# Patient Record
Sex: Female | Born: 1950 | Race: White | Hispanic: No | Marital: Married | Smoking: Never smoker
Health system: Southern US, Community
[De-identification: ages and names within clinical notes are randomized; demographics above are authoritative.]

## PROBLEM LIST (undated history)

## (undated) DIAGNOSIS — Z8249 Family history of ischemic heart disease and other diseases of the circulatory system: Secondary | ICD-10-CM

## (undated) DIAGNOSIS — E785 Hyperlipidemia, unspecified: Secondary | ICD-10-CM

## (undated) DIAGNOSIS — T7840XA Allergy, unspecified, initial encounter: Secondary | ICD-10-CM

## (undated) DIAGNOSIS — E079 Disorder of thyroid, unspecified: Secondary | ICD-10-CM

## (undated) DIAGNOSIS — R42 Dizziness and giddiness: Secondary | ICD-10-CM

## (undated) DIAGNOSIS — R0789 Other chest pain: Secondary | ICD-10-CM

## (undated) DIAGNOSIS — E2839 Other primary ovarian failure: Secondary | ICD-10-CM

## (undated) DIAGNOSIS — R931 Abnormal findings on diagnostic imaging of heart and coronary circulation: Secondary | ICD-10-CM

## (undated) HISTORY — DX: Dizziness and giddiness: R42

## (undated) HISTORY — DX: Allergy, unspecified, initial encounter: T78.40XA

## (undated) HISTORY — DX: Disorder of thyroid, unspecified: E07.9

## (undated) HISTORY — DX: Other chest pain: R07.89

## (undated) HISTORY — DX: Hyperlipidemia, unspecified: E78.5

## (undated) HISTORY — PX: ECTOPIC PREGNANCY SURGERY: SHX613

## (undated) HISTORY — DX: Abnormal findings on diagnostic imaging of heart and coronary circulation: R93.1

## (undated) HISTORY — DX: Other primary ovarian failure: E28.39

## (undated) HISTORY — DX: Family history of ischemic heart disease and other diseases of the circulatory system: Z82.49

---

## 1998-03-13 ENCOUNTER — Other Ambulatory Visit: Admission: RE | Admit: 1998-03-13 | Discharge: 1998-03-13 | Payer: Self-pay | Admitting: *Deleted

## 2001-01-17 ENCOUNTER — Other Ambulatory Visit: Admission: RE | Admit: 2001-01-17 | Discharge: 2001-01-17 | Payer: Self-pay | Admitting: *Deleted

## 2001-07-06 ENCOUNTER — Emergency Department (HOSPITAL_COMMUNITY): Admission: EM | Admit: 2001-07-06 | Discharge: 2001-07-07 | Payer: Self-pay | Admitting: Emergency Medicine

## 2001-07-07 ENCOUNTER — Emergency Department (HOSPITAL_COMMUNITY): Admission: EM | Admit: 2001-07-07 | Discharge: 2001-07-07 | Payer: Self-pay

## 2001-07-07 ENCOUNTER — Encounter: Payer: Self-pay | Admitting: Emergency Medicine

## 2002-01-12 ENCOUNTER — Encounter: Admission: RE | Admit: 2002-01-12 | Discharge: 2002-01-12 | Payer: Self-pay | Admitting: *Deleted

## 2002-01-12 ENCOUNTER — Encounter: Payer: Self-pay | Admitting: *Deleted

## 2004-02-25 ENCOUNTER — Emergency Department (HOSPITAL_COMMUNITY): Admission: EM | Admit: 2004-02-25 | Discharge: 2004-02-25 | Payer: Self-pay | Admitting: Emergency Medicine

## 2004-02-26 ENCOUNTER — Emergency Department (HOSPITAL_COMMUNITY): Admission: EM | Admit: 2004-02-26 | Discharge: 2004-02-26 | Payer: Self-pay

## 2005-06-01 ENCOUNTER — Encounter: Admission: RE | Admit: 2005-06-01 | Discharge: 2005-06-01 | Payer: Self-pay | Admitting: *Deleted

## 2005-06-17 ENCOUNTER — Encounter: Admission: RE | Admit: 2005-06-17 | Discharge: 2005-06-17 | Payer: Self-pay | Admitting: *Deleted

## 2005-10-18 ENCOUNTER — Other Ambulatory Visit: Admission: RE | Admit: 2005-10-18 | Discharge: 2005-10-18 | Payer: Self-pay | Admitting: *Deleted

## 2005-10-20 ENCOUNTER — Encounter: Admission: RE | Admit: 2005-10-20 | Discharge: 2005-10-20 | Payer: Self-pay | Admitting: *Deleted

## 2006-07-27 ENCOUNTER — Encounter: Admission: RE | Admit: 2006-07-27 | Discharge: 2006-07-27 | Payer: Self-pay | Admitting: *Deleted

## 2009-02-06 ENCOUNTER — Encounter: Admission: RE | Admit: 2009-02-06 | Discharge: 2009-02-06 | Payer: Self-pay | Admitting: Family Medicine

## 2011-04-29 ENCOUNTER — Other Ambulatory Visit: Payer: Self-pay | Admitting: Family Medicine

## 2011-04-29 DIAGNOSIS — Z1231 Encounter for screening mammogram for malignant neoplasm of breast: Secondary | ICD-10-CM

## 2011-05-05 ENCOUNTER — Ambulatory Visit
Admission: RE | Admit: 2011-05-05 | Discharge: 2011-05-05 | Disposition: A | Discharge status: Home / Self Care | Payer: BC Managed Care – PPO | Source: Ambulatory Visit | Attending: Family Medicine | Admitting: Family Medicine

## 2011-05-05 DIAGNOSIS — Z1231 Encounter for screening mammogram for malignant neoplasm of breast: Secondary | ICD-10-CM

## 2011-05-26 ENCOUNTER — Encounter: Payer: Self-pay | Admitting: *Deleted

## 2011-05-26 DIAGNOSIS — R0789 Other chest pain: Secondary | ICD-10-CM | POA: Insufficient documentation

## 2016-04-14 ENCOUNTER — Encounter (HOSPITAL_COMMUNITY): Payer: Self-pay | Admitting: Emergency Medicine

## 2016-04-14 ENCOUNTER — Emergency Department (HOSPITAL_COMMUNITY): Payer: BLUE CROSS/BLUE SHIELD

## 2016-04-14 ENCOUNTER — Emergency Department (HOSPITAL_COMMUNITY)
Admission: EM | Admit: 2016-04-14 | Discharge: 2016-04-14 | Disposition: A | Discharge status: Home / Self Care | Payer: BLUE CROSS/BLUE SHIELD | Attending: Emergency Medicine | Admitting: Emergency Medicine

## 2016-04-14 DIAGNOSIS — Z79899 Other long term (current) drug therapy: Secondary | ICD-10-CM | POA: Insufficient documentation

## 2016-04-14 DIAGNOSIS — E039 Hypothyroidism, unspecified: Secondary | ICD-10-CM | POA: Diagnosis not present

## 2016-04-14 DIAGNOSIS — R0789 Other chest pain: Secondary | ICD-10-CM | POA: Diagnosis present

## 2016-04-14 LAB — HEPATIC FUNCTION PANEL
ALBUMIN: 4.4 g/dL (ref 3.5–5.0)
ALT: 19 U/L (ref 14–54)
AST: 27 U/L (ref 15–41)
Alkaline Phosphatase: 84 U/L (ref 38–126)
Bilirubin, Direct: <0.1 mg/dL — ABNORMAL LOW (ref 0.1–0.5)
TOTAL PROTEIN: 7.1 g/dL (ref 6.5–8.1)
Total Bilirubin: 0.4 mg/dL (ref 0.3–1.2)

## 2016-04-14 LAB — CBC
HCT: 39 % (ref 36.0–46.0)
Hemoglobin: 13.2 g/dL (ref 12.0–15.0)
MCH: 31.7 pg (ref 26.0–34.0)
MCHC: 33.8 g/dL (ref 30.0–36.0)
MCV: 93.8 fL (ref 78.0–100.0)
PLATELETS: 206 10*3/uL (ref 150–400)
RBC: 4.16 MIL/uL (ref 3.87–5.11)
RDW: 13 % (ref 11.5–15.5)
WBC: 5 10*3/uL (ref 4.0–10.5)

## 2016-04-14 LAB — T4, FREE: FREE T4: 0.77 ng/dL (ref 0.61–1.12)

## 2016-04-14 LAB — BASIC METABOLIC PANEL
Anion gap: 8 (ref 5–15)
BUN: 19 mg/dL (ref 6–20)
CHLORIDE: 105 mmol/L (ref 101–111)
CO2: 25 mmol/L (ref 22–32)
CREATININE: 0.77 mg/dL (ref 0.44–1.00)
Calcium: 9.6 mg/dL (ref 8.9–10.3)
GFR calc non Af Amer: >60 mL/min (ref >60)
Glucose, Bld: 109 mg/dL — ABNORMAL HIGH (ref 65–99)
Potassium: 4 mmol/L (ref 3.5–5.1)
SODIUM: 138 mmol/L (ref 135–145)

## 2016-04-14 LAB — TROPONIN I

## 2016-04-14 LAB — LIPASE, BLOOD: LIPASE: 36 U/L (ref 11–51)

## 2016-04-14 LAB — I-STAT TROPONIN, ED: TROPONIN I, POC: 0 ng/mL (ref 0.00–0.08)

## 2016-04-14 LAB — TSH: TSH: 1.839 u[IU]/mL (ref 0.350–4.500)

## 2016-04-14 MED ORDER — OMEPRAZOLE 20 MG PO CPDR: 20.0000 mg | DELAYED_RELEASE_CAPSULE | Freq: Every day | ORAL | 0 refills | Status: DC

## 2016-04-14 MED ORDER — GI COCKTAIL ~~LOC~~
30.0000 mL | Freq: Once | ORAL | Status: AC
  Administered 2016-04-14: 30 mL via ORAL
  Filled 2016-04-14: qty 30

## 2016-04-14 NOTE — ED Triage Notes (Addendum)
Pt c/o racing heart and elevated heart rate; pt states her face started feeling numb on the way to ER; states she is under a lot of stress due to putting her mother in a nursing home; pt ambulatory to triage room; tearful and anxious during triage; BP 201/111; pt took 2 baby aspirin PTA

## 2016-04-14 NOTE — ED Provider Notes (Signed)
[facility]-EMERGENCY DEPT Provider Note   CSN: [id number] Arrival date & time: 04/14/16  0801     History   Chief Complaint Chief Complaint  Patient presents with  . Tachycardia  . Hypertension    HPI Toni Harris is a 66 y.o. female.  HPI  Patient presents after an episode of chest tightness, palpitations. Significant today, after awakening. Patient was well last night, going to bed. She notes that upon awakening she felt tightness, palpitations, numbness. There is some associated facial dysesthesia. Symptoms improved spontaneously, without clear intervention, and she has minimal symptoms currently. Patient notes a history of hypertension, denies diabetes, denies heart disease. Patient drinks, does not smoke.   Past Medical History:  Diagnosis Date  . Chest tightness   . Hyperlipidemia   . Thyroid disease    hypo    Patient Active Problem List   Diagnosis Date Noted  . Chest tightness     Past Surgical History:  Procedure Laterality Date  . ECTOPIC PREGNANCY SURGERY     x2    OB History    No data available       Home Medications    Prior to Admission medications   Medication Sig Start Date End Date Taking? Authorizing Provider  fluticasone (FLONASE) 50 MCG/ACT nasal spray Place 1 spray into both nostrils daily as needed for allergies or rhinitis.   Yes Historical Provider, MD  amoxicillin-clavulanate (AUGMENTIN) 875-125 MG tablet Take 1 tablet by mouth 2 (two) times daily.    Historical Provider, MD    Family History No family history on file.  Social History Social History  Substance Use Topics  . Smoking status: Never Smoker  . Smokeless tobacco: Never Used  . Alcohol use Yes     Comment: daily     Allergies   Patient has no known allergies.   Review of Systems Review of Systems  Constitutional:       Per HPI, otherwise negative  HENT:       Per HPI, otherwise negative  Respiratory:       Per HPI, otherwise negative    Cardiovascular:       Per HPI, otherwise negative  Gastrointestinal: Negative for vomiting.  Endocrine:       Negative aside from HPI  Genitourinary:       Neg aside from HPI   Musculoskeletal:       Per HPI, otherwise negative  Skin: Negative.   Neurological: Negative for syncope.     Physical Exam Updated Vital Signs BP 157/81 (BP Location: Left Arm)   Pulse 75   Temp 97.5 F (36.4 C) (Oral)   Resp 16   SpO2 100%   Physical Exam  Constitutional: She is oriented to person, place, and time. She appears well-developed and well-nourished. No distress.  HENT:  Head: Normocephalic and atraumatic.  Eyes: Conjunctivae and EOM are normal.  Cardiovascular: Normal rate, regular rhythm and intact distal pulses.   No appreciable distinction between peripheral pulses  Pulmonary/Chest: Effort normal and breath sounds normal. No stridor. No respiratory distress.  Abdominal: She exhibits no distension.  Musculoskeletal: She exhibits no edema.  Neurological: She is alert and oriented to person, place, and time. No cranial nerve deficit. Coordination normal.  Skin: Skin is warm and dry.  Psychiatric: She has a normal mood and affect.  Nursing note and vitals reviewed.    ED Treatments / Results  Labs (all labs ordered are listed, but only abnormal results are displayed) Labs Reviewed  BASIC METABOLIC PANEL - Abnormal; Notable for the following:       Result Value   Glucose, Bld 109 (*)    All other components within normal limits  HEPATIC FUNCTION PANEL - Abnormal; Notable for the following:    Bilirubin, Direct <0.1 (*)    All other components within normal limits  CBC  LIPASE, BLOOD  TSH  TROPONIN I  T4, FREE  I-STAT TROPOININ, ED    EKG  EKG Interpretation  Date/Time:  Wednesday April 14 2016 08:31:12 EST Ventricular Rate:  98 PR Interval:    QRS Duration: 82 QT Interval:  360 QTC Calculation: 460 R Axis:   62 Text Interpretation:  Sinus rhythm Low voltage,  precordial leads Artifact Abnormal ekg Confirmed by Gerhard Munch  MD ([id number]) on 04/14/2016 8:35:02 AM       Radiology Dg Chest 2 View  Result Date: 04/14/2016 CLINICAL DATA:  Chest pain.  Palpitations.  Tachycardia. EXAM: CHEST  2 VIEW COMPARISON:  None FINDINGS: The heart size and mediastinal contours are within normal limits. Both lungs are clear. The visualized skeletal structures are unremarkable. IMPRESSION: Normal chest Electronically Signed   By: Francene Boyers M.D.   On: 04/14/2016 09:13    Procedures Procedures (including critical care time)  Medications Ordered in ED Medications  gi cocktail (Maalox,Lidocaine,Donnatal) (30 mLs Oral Given 04/14/16 0916)     Initial Impression / Assessment and Plan / ED Course  I have reviewed the triage vital signs and the nursing notes.  Pertinent labs & imaging results that were available during my care of the patient were reviewed by me and considered in my medical decision making (see chart for details).  Update: After returned initial labs and discussed the findings with patient and her husband. Patient states that she has not been taking thyroid medication years, after a reassuring lab results. Troponin 2 pending   1:09 PM Patient in no distress awake, alert, smiling. We discussed all findings again at length, including 2 negative troponin. Patient is no ongoing symptoms. With resolution of symptoms, serial negative troponin, nonischemic EKG, no history of CAD, there is no evidence for PE, ammonia, other acute new pathology. Patient may have gastroesophageal etiology, but low suspicion for other occult new acute disease. Patient started on PPI therapy, will follow up with GI, primary care.  Final Clinical Impressions(s) / ED Diagnoses  A typical chest pain   Gerhard Munch, MD 04/14/16 1310

## 2016-04-14 NOTE — ED Notes (Signed)
She states she is no longer experiencing palpitations.

## 2016-04-14 NOTE — Discharge Instructions (Signed)
As discussed, today's evaluation has been generally reassuring.  Your pain may be due to inflammation of the stomach and esophagus.  Please take all medication as directed, and be sure to follow-up with both GASTROENTEROLOGIST and your primary care physician.  Return here for concerning changes in your condition.

## 2018-02-13 ENCOUNTER — Other Ambulatory Visit (INDEPENDENT_AMBULATORY_CARE_PROVIDER_SITE_OTHER): Payer: Self-pay | Admitting: Physician Assistant

## 2018-02-13 ENCOUNTER — Ambulatory Visit
Admission: RE | Admit: 2018-02-13 | Discharge: 2018-02-13 | Disposition: A | Discharge status: Home / Self Care | Payer: BLUE CROSS/BLUE SHIELD | Source: Ambulatory Visit | Attending: Physician Assistant | Admitting: Physician Assistant

## 2018-02-13 DIAGNOSIS — R52 Pain, unspecified: Secondary | ICD-10-CM

## 2018-11-29 ENCOUNTER — Other Ambulatory Visit: Payer: Self-pay

## 2018-11-29 ENCOUNTER — Ambulatory Visit (HOSPITAL_COMMUNITY)
Admission: RE | Admit: 2018-11-29 | Discharge: 2018-11-29 | Disposition: A | Discharge status: Home / Self Care | Payer: Medicare Other | Source: Ambulatory Visit | Attending: Family Medicine | Admitting: Family Medicine

## 2018-11-29 ENCOUNTER — Other Ambulatory Visit (HOSPITAL_COMMUNITY): Payer: Self-pay | Admitting: Family Medicine

## 2018-11-29 DIAGNOSIS — M7989 Other specified soft tissue disorders: Secondary | ICD-10-CM | POA: Diagnosis not present

## 2018-11-29 DIAGNOSIS — M79604 Pain in right leg: Secondary | ICD-10-CM | POA: Insufficient documentation

## 2019-07-16 ENCOUNTER — Other Ambulatory Visit: Payer: Self-pay | Admitting: *Deleted

## 2019-07-16 ENCOUNTER — Telehealth (HOSPITAL_COMMUNITY): Payer: Self-pay

## 2019-07-16 DIAGNOSIS — M7989 Other specified soft tissue disorders: Secondary | ICD-10-CM

## 2019-07-16 NOTE — Telephone Encounter (Signed)

## 2019-07-17 ENCOUNTER — Encounter: Payer: Self-pay | Admitting: Vascular Surgery

## 2019-07-17 ENCOUNTER — Ambulatory Visit (HOSPITAL_COMMUNITY)
Admission: RE | Admit: 2019-07-17 | Discharge: 2019-07-17 | Disposition: A | Discharge status: Home / Self Care | Payer: Medicare Other | Source: Ambulatory Visit | Attending: Vascular Surgery | Admitting: Vascular Surgery

## 2019-07-17 ENCOUNTER — Ambulatory Visit: Payer: Medicare Other | Admitting: Vascular Surgery

## 2019-07-17 ENCOUNTER — Other Ambulatory Visit: Payer: Self-pay

## 2019-07-17 VITALS — BP 166/89 | HR 71 | Temp 97.8°F | Resp 20 | Ht 66.0 in | Wt 152.0 lb

## 2019-07-17 DIAGNOSIS — M7989 Other specified soft tissue disorders: Secondary | ICD-10-CM

## 2019-07-17 DIAGNOSIS — M79604 Pain in right leg: Secondary | ICD-10-CM

## 2019-07-17 NOTE — Progress Notes (Signed)
Vascular and Vein Specialist of [facility]  Patient name: Toni Harris MRN: [id number] DOB: [birth year] Sex: female  REASON FOR CONSULT: Evaluation venous pathology right greater than left lower extremity  HPI: Toni Harris is a 68 y.o. female, who is here today for evaluation.  She was concern regarding potential issues regarding her lower extremity venous pathology.  She had family member suffered a pulmonary embolus in the past.  She also was diagnosed with right leg Baker's cyst in September 2020 and was concerned regarding some potential relationship with this.  She has no history of DVT or pulmonary embolus or self.  No history of superficial thrombophlebitis.  She is otherwise healthy.  She has minimal lower extremity swelling  Past Medical History:  Diagnosis Date  . Allergy   . Chest tightness   . Hyperlipidemia   . Thyroid disease    hypo    History reviewed. No pertinent family history.  SOCIAL HISTORY: Social History   Socioeconomic History  . Marital status: Married    Spouse name: Not on file  . Number of children: Not on file  . Years of education: Not on file  . Highest education level: Not on file  Occupational History  . Not on file  Tobacco Use  . Smoking status: Never Smoker  . Smokeless tobacco: Never Used  Substance and Sexual Activity  . Alcohol use: Yes    Comment: daily  . Drug use: Yes    Types: Marijuana    Comment: occasionally  . Sexual activity: Not on file  Other Topics Concern  . Not on file  Social History Narrative  . Not on file   Social Determinants of Health   Financial Resource Strain:   . Difficulty of Paying Living Expenses:   Food Insecurity:   . Worried About Charity fundraiser in the Last Year:   . Arboriculturist in the Last Year:   Transportation Needs:   . Film/video editor (Medical):   Marland Kitchen Lack of Transportation (Non-Medical):   Physical Activity:   . Days of Exercise  per Week:   . Minutes of Exercise per Session:   Stress:   . Feeling of Stress :   Social Connections:   . Frequency of Communication with Friends and Family:   . Frequency of Social Gatherings with Friends and Family:   . Attends Religious Services:   . Active Member of Clubs or Organizations:   . Attends Archivist Meetings:   Marland Kitchen Marital Status:   Intimate Partner Violence:   . Fear of Current or Ex-Partner:   . Emotionally Abused:   Marland Kitchen Physically Abused:   . Sexually Abused:     No Known Allergies  Current Outpatient Medications  Medication Sig Dispense Refill  . fluticasone (FLONASE) 50 MCG/ACT nasal spray Place 1 spray into both nostrils daily as needed for allergies or rhinitis.     No current facility-administered medications for this visit.    REVIEW OF SYSTEMS:  [X]  denotes positive finding, [ ]  denotes negative finding Cardiac  Comments:  Chest pain or chest pressure:    Shortness of breath upon exertion:    Short of breath when lying flat:    Irregular heart rhythm:        Vascular    Pain in calf, thigh, or hip brought on by ambulation:    Pain in feet at night that wakes you up from your sleep:  Blood clot in your veins:    Leg swelling:         Pulmonary    Oxygen at home:    Productive cough:     Wheezing:         Neurologic    Sudden weakness in arms or legs:     Sudden numbness in arms or legs:     Sudden onset of difficulty speaking or slurred speech:    Temporary loss of vision in one eye:     Problems with dizziness:         Gastrointestinal    Blood in stool:     Vomited blood:         Genitourinary    Burning when urinating:     Blood in urine:        Psychiatric    Major depression:         Hematologic    Bleeding problems:    Problems with blood clotting too easily:        Skin    Rashes or ulcers:        Constitutional    Fever or chills:      PHYSICAL EXAM: Vitals:   07/17/19 1537  BP: (!) 166/89   Pulse: 71  Resp: 20  Temp: 97.8 F (36.6 C)  SpO2: 97%  Weight: 152 lb (68.9 kg)  Height: 5\' 6"  (1.676 m)    GENERAL: The patient is a well-nourished female, in no acute distress. The vital signs are documented above. CARDIOVASCULAR: 2+ radial and 2+ dorsalis pedis pulses bilaterally.  Scattered reticular veins in both lower extremities over the left pretibial space and medial right thigh PULMONARY: There is good air exchange  ABDOMEN: Soft and non-tender  MUSCULOSKELETAL: There are no major deformities or cyanosis. NEUROLOGIC: No focal weakness or paresthesias are detected. SKIN: There are no ulcers or rashes noted. PSYCHIATRIC: The patient has a normal affect.  DATA:  Duplex shows no evidence of DVT or venous obstruction.  She does not have any enlargement of her saphenous vein bilaterally.  She does have reflux in her right great saphenous vein and her left saphenofemoral junction  MEDICAL ISSUES: I discussed these findings with the patient.  Explained that she does not have any evidence of severe venous pathology.  I would recommend continued conservative observation only.  She is having minimal symptoms and therefore would not recommend compression garments.  She was reassured with this discussion see again on an as-needed basis   [place], MD [facility] Vascular and Vein Specialists of [facility] Tel [phone] Pager [phone]

## 2019-07-23 IMAGING — CR DG SHOULDER 2+V*L*
3 series · 3 of 3 positions shown · non-contrast
Comparison: None.

CLINICAL DATA: Chronic left shoulder pain

EXAM:
LEFT SHOULDER - 2+ VIEW

[w shoulder grashey left]
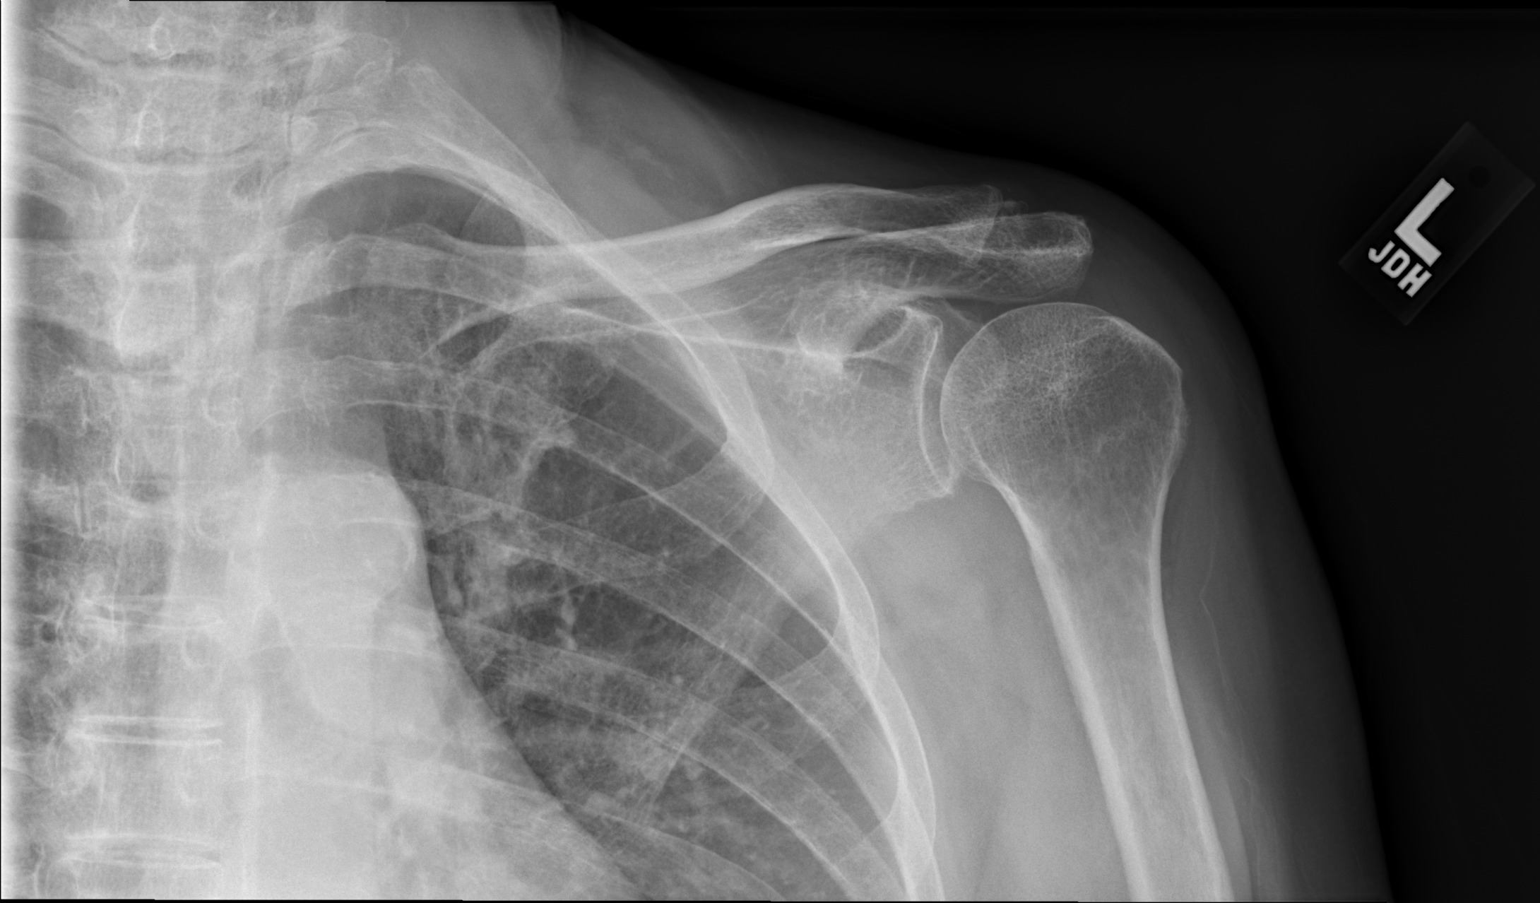

[w shoulder y-view left]
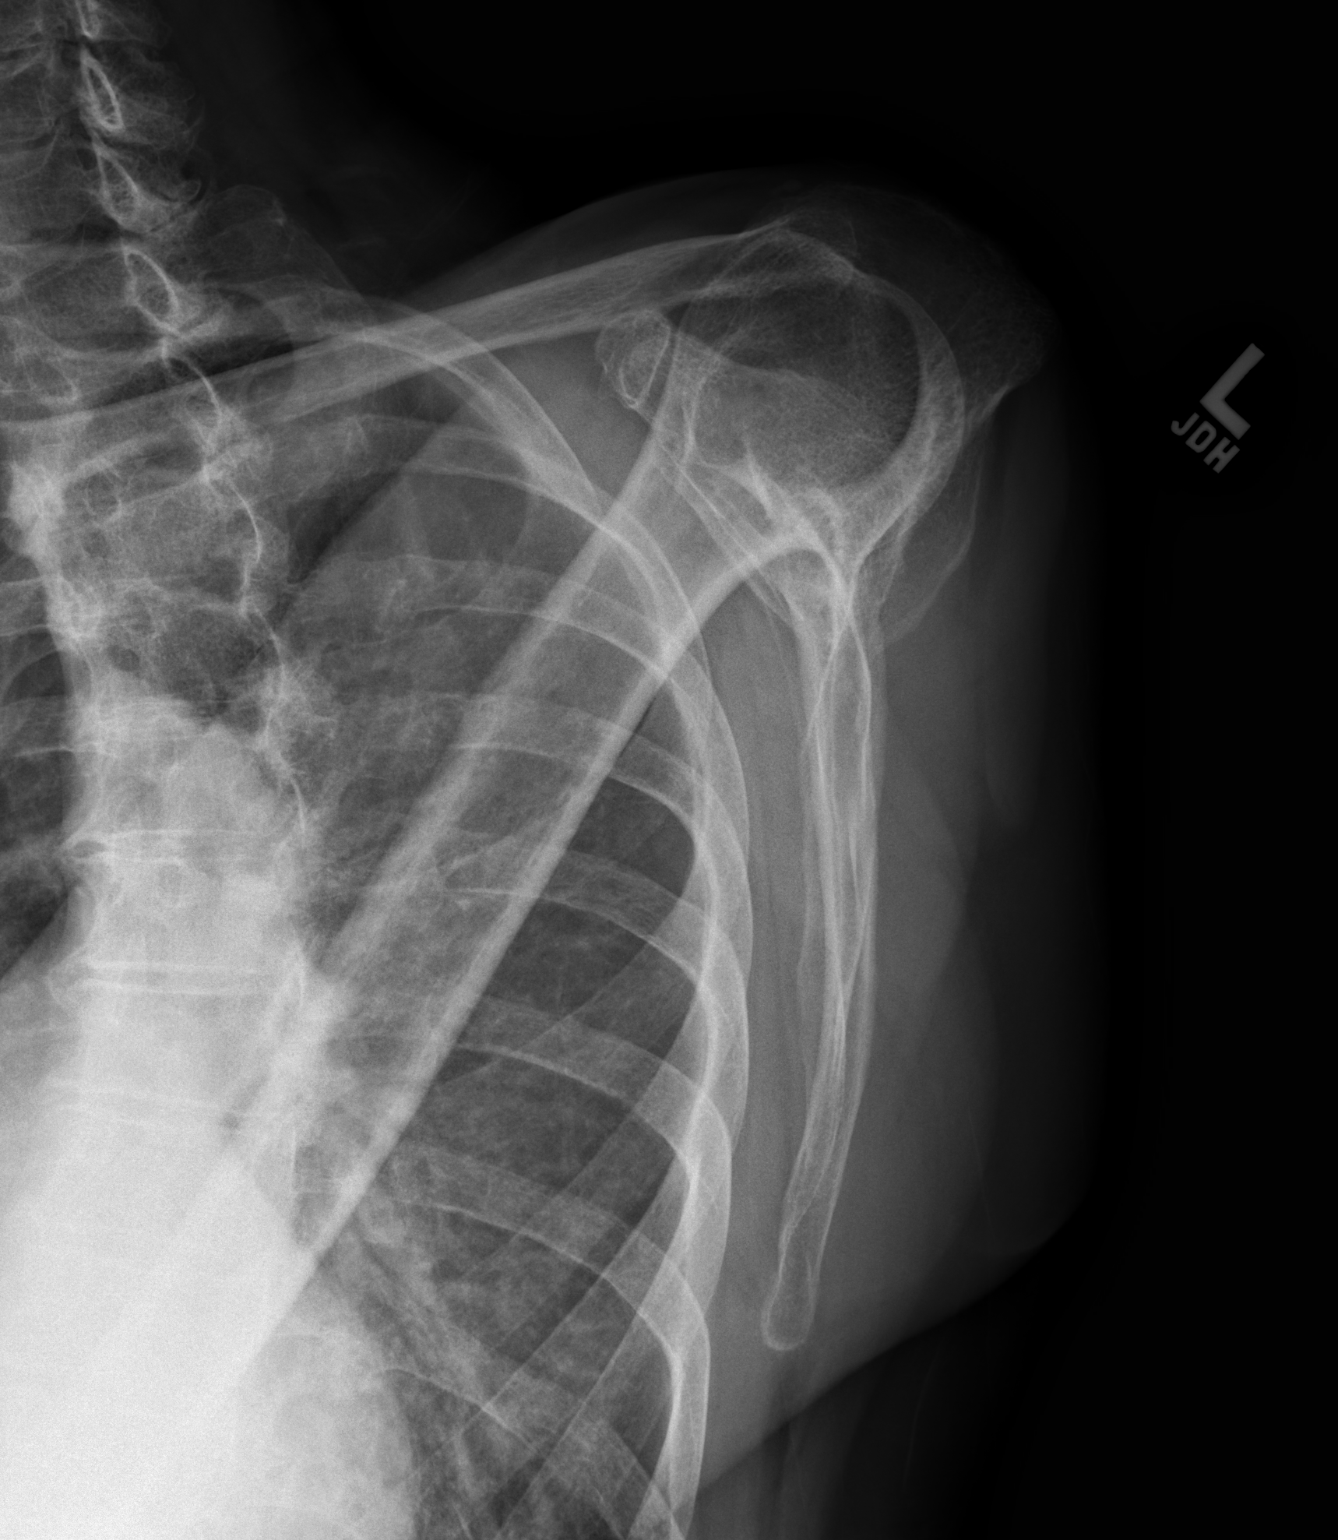

[w shoulder axillary left]
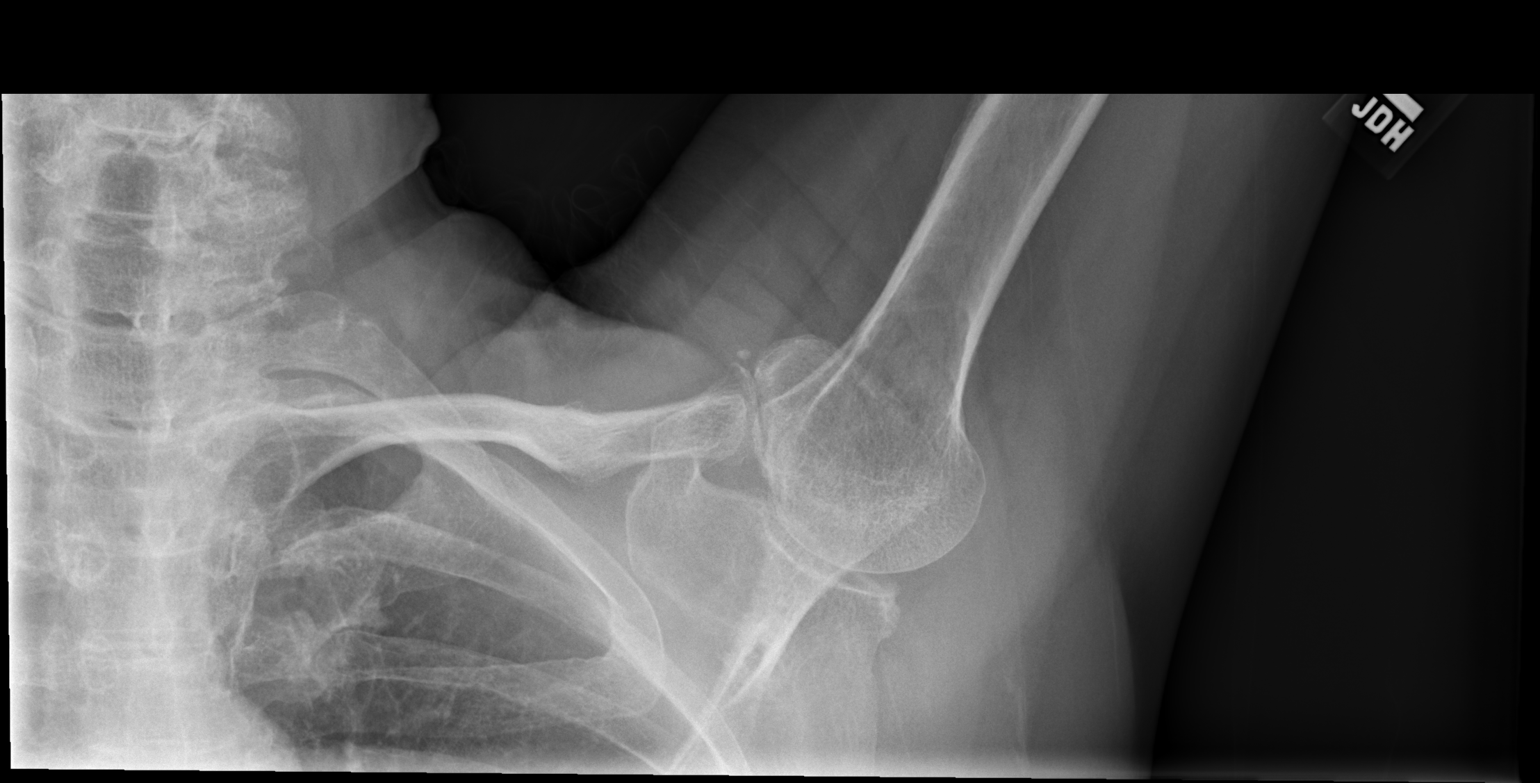

[3 of 3 positions shown; findings below may reference images not displayed]

FINDINGS: Degenerative changes in the AC joint with joint space narrowing and
spurring. Glenohumeral joint is maintained. No acute bony
abnormality. Specifically, no fracture, subluxation, or dislocation.
Soft tissues are intact.
IMPRESSION: Mild-to-moderate degenerative changes in the left AC joint. No acute
bony abnormality.

## 2019-10-25 DIAGNOSIS — I8393 Asymptomatic varicose veins of bilateral lower extremities: Secondary | ICD-10-CM | POA: Insufficient documentation

## 2020-06-12 ENCOUNTER — Other Ambulatory Visit: Payer: Self-pay

## 2020-06-12 ENCOUNTER — Ambulatory Visit: Payer: Medicare Other | Admitting: Vascular Surgery

## 2020-06-12 ENCOUNTER — Encounter: Payer: Self-pay | Admitting: Vascular Surgery

## 2020-06-12 VITALS — BP 140/85 | HR 87 | Temp 98.1°F | Resp 16 | Ht 66.0 in | Wt 152.0 lb

## 2020-06-12 DIAGNOSIS — I872 Venous insufficiency (chronic) (peripheral): Secondary | ICD-10-CM | POA: Diagnosis not present
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
## 2021-12-20 NOTE — Progress Notes (Unsigned)
Cardiology Office Note:    Date:  12/20/2021   ID:  Toni Harris, DOB [birth year], MRN [id number]  PCP:  Patient, No Pcp Per   [facility] Providers Cardiologist:  None { Click to update primary MD,subspecialty MD or APP then REFRESH:1}    Referring MD: Aletha Halim., PA-C   No chief complaint on file. ***  History of Present Illness:    Toni Harris is a 71 y.o. female with a hx of ***  Past Medical History:  Diagnosis Date   Agatston CAC score, <100    Allergy    Chest tightness    Estrogen deficiency    Family history of cardiovascular disease    Hyperlipidemia    Thyroid disease    hypo   Vertigo     Past Surgical History:  Procedure Laterality Date   ECTOPIC PREGNANCY SURGERY     x2    Current Medications: No outpatient medications have been marked as taking for the 12/24/21 encounter (Appointment) with Freada Bergeron, MD.     Allergies:   Patient has no known allergies.   Social History   Socioeconomic History   Marital status: Married    Spouse name: Not on file   Number of children: Not on file   Years of education: Not on file   Highest education level: Not on file  Occupational History   Not on file  Tobacco Use   Smoking status: Never   Smokeless tobacco: Never  Vaping Use   Vaping Use: Never used  Substance and Sexual Activity   Alcohol use: Yes    Comment: daily   Drug use: Yes    Types: Marijuana    Comment: occasionally   Sexual activity: Not on file  Other Topics Concern   Not on file  Social History Narrative   Not on file   Social Determinants of Health   Financial Resource Strain: Not on file  Food Insecurity: Not on file  Transportation Needs: Not on file  Physical Activity: Not on file  Stress: Not on file  Social Connections: Not on file     Family History: The patient's ***family history includes Heart disease in her mother; Hodgkin's lymphoma in her father; Pulmonary embolism in her  mother; Varicose Veins in her maternal grandmother and mother.  ROS:   Please see the history of present illness.    *** All other systems reviewed and are negative.  EKGs/Labs/Other Studies Reviewed:    The following studies were reviewed today: ***  EKG:  EKG is *** ordered today.  The ekg ordered today demonstrates ***  Recent Labs: No results found for requested labs within last 365 days.  Recent Lipid Panel No results found for: "CHOL", "TRIG", "HDL", "CHOLHDL", "VLDL", "LDLCALC", "LDLDIRECT"   Risk Assessment/Calculations:   {Does this patient have ATRIAL FIBRILLATION?:[phone]}  No BP recorded.  {Refresh Note OR Click here to enter BP  :1}***         Physical Exam:    VS:  There were no vitals taken for this visit.    Wt Readings from Last 3 Encounters:  06/12/20 152 lb (68.9 kg)  07/17/19 152 lb (68.9 kg)     GEN: *** Well nourished, well developed in no acute distress HEENT: Normal NECK: No JVD; No carotid bruits LYMPHATICS: No lymphadenopathy CARDIAC: ***RRR, no murmurs, rubs, gallops RESPIRATORY:  Clear to auscultation without rales, wheezing or rhonchi  ABDOMEN: Soft, non-tender, non-distended MUSCULOSKELETAL:  No edema;  No deformity  SKIN: Warm and dry NEUROLOGIC:  Alert and oriented x 3 PSYCHIATRIC:  Normal affect   ASSESSMENT:    No diagnosis found. PLAN:    In order of problems listed above:  ***      {Are you ordering a CV Procedure (e.g. stress test, cath, DCCV, TEE, etc)?   Press F2        :[id number]}    Medication Adjustments/Labs and Tests Ordered: Current medicines are reviewed at length with the patient today.  Concerns regarding medicines are outlined above.  No orders of the defined types were placed in this encounter.  No orders of the defined types were placed in this encounter.   There are no Patient Instructions on file for this visit.   Signed, Meriam Sprague, MD  12/20/2021 2:45 PM    [facility]

## 2021-12-24 ENCOUNTER — Encounter: Payer: Self-pay | Admitting: Cardiology

## 2021-12-24 ENCOUNTER — Ambulatory Visit: Payer: Medicare Other | Attending: Cardiology | Admitting: Cardiology

## 2021-12-24 VITALS — BP 120/80 | HR 66 | Ht 66.0 in | Wt 149.0 lb

## 2021-12-24 DIAGNOSIS — Z8249 Family history of ischemic heart disease and other diseases of the circulatory system: Secondary | ICD-10-CM | POA: Diagnosis not present

## 2021-12-24 DIAGNOSIS — R931 Abnormal findings on diagnostic imaging of heart and coronary circulation: Secondary | ICD-10-CM

## 2021-12-24 DIAGNOSIS — E782 Mixed hyperlipidemia: Secondary | ICD-10-CM

## 2021-12-24 MED ORDER — EZETIMIBE 10 MG PO TABS: 10.0000 mg | ORAL_TABLET | Freq: Every day | ORAL | 3 refills | Status: DC

## 2021-12-24 NOTE — Patient Instructions (Signed)
Medication Instructions:   START TAKING ZETIA 10 MG BY MOUTH DAILY  *If you need a refill on your cardiac medications before your next appointment, please call your pharmacy*   You have been referred to [place]: [facility], you and your health needs are our priority.  As part of our continuing mission to provide you with exceptional heart care, we have created designated Provider Care Teams.  These Care Teams include your primary Cardiologist (physician) and Advanced Practice Providers (APPs -  Physician Assistants and Nurse Practitioners) who all work together to provide you with the care you need, when you need it.  We recommend signing up for the patient portal called "MyChart".  Sign up information is provided on this After Visit Summary.  MyChart is used to connect with patients for Virtual Visits (Telemedicine).  Patients are able to view lab/test results, encounter notes, upcoming appointments, etc.  Non-urgent messages can be sent to your provider as well.   To learn more about what you can do with MyChart, go to [email or web address].    Your next appointment:   1 year(s)  The format for your next appointment:   In Person  Provider:   DR. Johney Frame  Important Information About Sugar

## 2021-12-24 NOTE — Progress Notes (Signed)
Cardiology Office Note:    Date:  12/24/2021   ID:  Toni Harris, DOB [birth year], MRN [id number]  PCP:  [facility]   [facility] Providers Cardiologist:  None   Referring MD: Richmond Campbell., PA-C    History of Present Illness:    Toni Harris is a 71 y.o. female with a hx of HLD, coronary calcification with Ca score 77.1 (73%), and family history of CAD who was referred by Toni Gemma, PA-C for further evaluation of coronary artery calcification.  Patient was seen by Toni Harris on 11/24/21. Note reviewed. Underwent coronary Ca score which was 77.1 (73%). She has been maintained on crestor 20mg  daily.   Today, the patient states that she is feeling fine. She denies any chest pain, or shortness of breath.  For at least 2 years she has been on medication for hyperlipidemia. Currently she takes 20 mg rosuvastatin. During a recent trip to she forgot to pack her medications. She was off of them for about 2 weeks. Most recent lipids were not on her medication (LDL 146, TG 274)  She endorses venous insufficiency. Typically she sleeps with her legs elevated which helps manage her swelling.  In her family, she confirms known heart disease on her maternal side. Her grandfather had a heart attack at 3 yo, and died at 69 yo of a massive heart attack. Her grandmother had heart failure at 82 yo. Mother had a PE with cardiac arrest x2, she lived for 3 years afterwards.  Regarding her diet she has avoided red meats for 40 years. For exercise she participates in yoga for 4-5 days a week. She does not smoke.  She denies any palpitations, lightheadedness, headaches, syncope, orthopnea, or PND.  She complains of a tick bite on her abdomen that is bleeding. [place] tick bites and treated for tick-borne diseases.   Past Medical History:  Diagnosis Date   Agatston CAC score, <100    Allergy     Chest tightness    Estrogen deficiency    Family history of cardiovascular disease    Hyperlipidemia    Thyroid disease    hypo   Vertigo     Past Surgical History:  Procedure Laterality Date   ECTOPIC PREGNANCY SURGERY     x2    Current Medications: Current Meds  Medication Sig   ezetimibe (ZETIA) 10 MG tablet Take 1 tablet (10 mg total) by mouth daily.   fluticasone (FLONASE) 50 MCG/ACT nasal spray Place 1 spray into both nostrils daily as needed for allergies or rhinitis.   rizatriptan (MAXALT) 5 MG tablet TAKE 1 TABLET BY MOUTH AS NEEDED FOR MIGRAINE. MAY REPEAT IN 2 HOURS IF NEEDED   rosuvastatin (CRESTOR) 20 MG tablet Take 20 mg by mouth at bedtime.     Allergies:   Patient has no known allergies.   Social History   Socioeconomic History   Marital status: Married    Spouse name: Not on file   Number of children: Not on file   Years of education: Not on file   Highest education level: Not on file  Occupational History   Not on file  Tobacco Use   Smoking status: Never   Smokeless tobacco: Never  Vaping Use   Vaping Use: Never used  Substance and Sexual Activity   Alcohol use: Yes    Comment: daily   Drug use: Yes  Types: Marijuana    Comment: occasionally   Sexual activity: Not on file  Other Topics Concern   Not on file  Social History Narrative   Not on file   Social Determinants of Health   Financial Resource Strain: Not on file  Food Insecurity: Not on file  Transportation Needs: Not on file  Physical Activity: Not on file  Stress: Not on file  Social Connections: Not on file     Family History: The patient's family history includes Heart disease in her mother; Hodgkin's lymphoma in her father; Pulmonary embolism in her mother; Varicose Veins in her maternal grandmother and mother.  ROS:   Review of Systems  Constitutional:  Negative for chills and fever.  HENT:  Negative for nosebleeds and tinnitus.   Eyes:  Negative for blurred  vision and pain.  Respiratory:  Negative for cough, hemoptysis, shortness of breath and stridor.   Cardiovascular:  Negative for chest pain, palpitations, orthopnea, claudication, leg swelling and PND.  Gastrointestinal:  Negative for blood in stool, diarrhea, nausea and vomiting.  Genitourinary:  Negative for dysuria and hematuria.  Musculoskeletal:  Negative for falls.  Neurological:  Negative for dizziness, loss of consciousness and headaches.  Psychiatric/Behavioral:  Negative for depression, hallucinations and substance abuse. The patient does not have insomnia.      EKGs/Labs/Other Studies Reviewed:    The following studies were reviewed today:  CT Calcium Score  09/17/2021  ([facility]): FINDINGS:  CORONARY CALCIUM:  Left main coronary artery: 0  Left anterior descending: 52.248  Circumflex: 0  Right coronary artery: 24.89  Total Agatston score: 77.1  MESA database percentile: 73rd   INCIDENTAL FINDINGS: Aortic root and aortic valve calcifications. Degenerative changes of the visualized spine.   CONCLUSION:  Calcified atherosclerotic disease is evident within the coronary arterial distribution. The patient's total coronary artery calcium score is 77.1, which places the patient in the 73rd percentile for individuals of matched age, gender, and race/ethnicity.  EKG:  EKG is personally reviewed. 12/24/2021:  Sinus rhythm. Rate 66 bpm.   Recent Labs: No results found for requested labs within last 365 days.   Recent Lipid Panel No results found for: "CHOL", "TRIG", "HDL", "CHOLHDL", "VLDL", "LDLCALC", "LDLDIRECT"   Risk Assessment/Calculations:                Physical Exam:    VS:  BP 120/80 (BP Location: Left Arm, Patient Position: Sitting, Cuff Size: Normal)   Pulse 66   Ht 5\' 6"  (1.676 m)   Wt 149 lb (67.6 kg)   BMI 24.05 kg/m     Wt Readings from Last 3 Encounters:  12/24/21 149 lb (67.6 kg)  06/12/20 152 lb (68.9 kg)  07/17/19 152 lb (68.9 kg)      GEN: Well nourished, well developed in no acute distress HEENT: Normal NECK: No JVD; No carotid bruits LYMPHATICS: No lymphadenopathy CARDIAC: RRR, no murmurs, rubs, gallops RESPIRATORY:  Clear to auscultation without rales, wheezing or rhonchi  ABDOMEN: Soft, non-tender, non-distended. Tick bite of central abdomen. MUSCULOSKELETAL:  No edema; No deformity  SKIN: Warm and dry NEUROLOGIC:  Alert and oriented x 3 PSYCHIATRIC:  Normal affect   ASSESSMENT:    1. Agatston coronary artery calcium score less than 100   2. Mixed hyperlipidemia   3. Family history of early CAD    PLAN:    In order of problems listed above:  #Coronary Artery Calcification: #Family History of CAD: #HLD: Ca score 77.1 (73%) with strong  family history of CAD. Currently doing well without anginal symptoms. Has been maintained on crestor 20mg  daily and is now taking it consistently. She is planned for repeat lipids with PCP in a month now that she has been taking her crestor. Given degree of HLD (mixed), she will likely need additional agents such as PCSK9i and vascepa. Will start zetia 10mg  daily and refer to HLD clinic.  -Continue crestor 20mg  daily -Start zetia 10mg  daily -Suspect she will need additional therapy; referred to HLD clinic -Goal LDL<70, TG<150 -Continue lifestyle modifications as detailed below  Exercise recommendations: Goal of exercising for at least 30 minutes a day, at least 5 times per week.  Please exercise to a moderate exertion.  This means that while exercising it is difficult to speak in full sentences, however you are not so short of breath that you feel you must stop, and not so comfortable that you can carry on a full conversation.  Exertion level should be approximately a 5/10, if 10 is the most exertion you can perform.  Diet recommendations: Recommend a heart healthy diet such as the Mediterranean diet.  This diet consists of plant based foods, healthy fats, lean meats,  olive oil.  It suggests limiting the intake of simple carbohydrates such as white breads, pastries, and pastas.  It also limits the amount of red meat, wine, and dairy products such as cheese that one should consume on a daily basis.           Follow-up:  1 year.  Medication Adjustments/Labs and Tests Ordered: Current medicines are reviewed at length with the patient today.  Concerns regarding medicines are outlined above.   Orders Placed This Encounter  Procedures   AMB Referral to [facility]care Pharm-D   EKG 12-Lead   Meds ordered this encounter  Medications   ezetimibe (ZETIA) 10 MG tablet    Sig: Take 1 tablet (10 mg total) by mouth daily.    Dispense:  90 tablet    Refill:  3   Patient Instructions  Medication Instructions:   START TAKING ZETIA 10 MG BY MOUTH DAILY  *If you need a refill on your cardiac medications before your next appointment, please call your pharmacy*   You have been referred to OUR LIPID CLINIC TO SEE THE PHARMACIST FOR MED MANAGEMENT OF LIPIDS   Follow-Up: At [facility], you and your health needs are our priority.  As part of our continuing mission to provide you with exceptional heart care, we have created designated Provider Care Teams.  These Care Teams include your primary Cardiologist (physician) and Advanced Practice Providers (APPs -  Physician Assistants and Nurse Practitioners) who all work together to provide you with the care you need, when you need it.  We recommend signing up for the patient portal called "MyChart".  Sign up information is provided on this After Visit Summary.  MyChart is used to connect with patients for Virtual Visits (Telemedicine).  Patients are able to view lab/test results, encounter notes, upcoming appointments, etc.  Non-urgent messages can be sent to your provider as well.   To learn more about what you can do with MyChart, go to .    Your next appointment:   1 year(s)  The  format for your next appointment:   In Person  Provider:   DR.  Important Information About Sugar         I,Mathew Stumpf,acting as a scribe for , MD.,have documented all relevant documentation on  the behalf of Meriam Sprague, MD,as directed by  Meriam Sprague, MD while in the presence of Meriam Sprague, MD.  I, Meriam Sprague, MD, have reviewed all documentation for this visit. The documentation on 12/24/21 for the exam, diagnosis, procedures, and orders are all accurate and complete.   Signed, Meriam Sprague, MD  12/24/2021 12:19 PM    [facility]

## 2022-02-04 ENCOUNTER — Telehealth: Payer: Self-pay | Admitting: Pharmacist

## 2022-02-04 ENCOUNTER — Ambulatory Visit: Payer: Medicare Other | Attending: Internal Medicine | Admitting: Student

## 2022-02-04 DIAGNOSIS — E782 Mixed hyperlipidemia: Secondary | ICD-10-CM | POA: Diagnosis not present

## 2022-02-04 DIAGNOSIS — E785 Hyperlipidemia, unspecified: Secondary | ICD-10-CM | POA: Insufficient documentation

## 2022-02-04 LAB — URIC ACID: Uric Acid: 4.6 mg/dL (ref 3.0–7.2)

## 2022-02-04 MED ORDER — NEXLIZET 180-10 MG PO TABS: 1.0000 | ORAL_TABLET | Freq: Every day | ORAL | 11 refills | Status: DC

## 2022-02-04 NOTE — Progress Notes (Signed)
Patient ID: MURDIS FLITTON                 DOB: 11/23/1950                    MRN: [id number]      HPI: Toni Harris is a 71 y.o. female patient referred to lipid clinic by Dr.Pemberton. PMH is significant for HLD, coronary calcification with Ca score 77.1 (73%), and family history of CAD  Today patient has no acute concern. Denies for chest pain, SOB,palpitations, lightheadedness, headaches, syncope, orthopnea, or PND. Takes rosuvastatin and ezetimibe regularly and tolerates them well. Have been eating healthy diet and exercise regularly. Have not been doing any cardio workout but will be re-starting. Discussed next step medication  options to achieve goal LDLc both nexletol and PCSK9i. Patient prefers oral option over injectables.    Current Medications: crestor 20mg  daily,zetia 10mg  daily  Intolerances:  Risk Factors: age, LDLc  >70 mg/dl, coronary calcification with Ca score 77.1, family hx of CAD LDL goal: <70 mg/dl   Diet: has avoided red meats for 40 years, eat out 2-3 times per week,   Exercise: yoga for 4-5 days a week   Family History: heart disease on her maternal side. Her grandfather had a heart attack at 23 yo, and died at 69 yo of a massive heart attack. Her grandmother had heart failure at 67 yo. Mother had a PE with cardiac arrest x2, she lived for 3 years afterwards.    Social History:  Alcohol: 1-2 drinks per day  Smoking: no Recreational drug use: no    Labs: Lipid Panel  LDL: 84 mg/dl, TG 76 mg/dl (92)   Past Medical History:  Diagnosis Date   Agatston CAC score, <100    Allergy    Chest tightness    Estrogen deficiency    Family history of cardiovascular disease    Hyperlipidemia    Thyroid disease    hypo   Vertigo     Current Outpatient Medications on File Prior to Visit  Medication Sig Dispense Refill   fluticasone (FLONASE) 50 MCG/ACT nasal spray Place 1 spray into both nostrils daily as needed for allergies or rhinitis.      rizatriptan (MAXALT) 5 MG tablet TAKE 1 TABLET BY MOUTH AS NEEDED FOR MIGRAINE. MAY REPEAT IN 2 HOURS IF NEEDED     rosuvastatin (CRESTOR) 20 MG tablet Take 20 mg by mouth at bedtime.     No current facility-administered medications on file prior to visit.    No Known Allergies   Hyperlipidemia Assessment:  LDLc above the goal 84 mg/dl on 630. (Goal 70mg /dl) Risk Factors: age, LDLc  >70 mg/dl, coronary calcification with Ca score 77.1, family hx of CAD Takes high intensity statin and ezetimibe 10 mg regularly and tolerates them well Discussed next mediation options to lower LDL- nexletol, PCSK9i, inclisiran their effectiveness, possible side effects and cost  Patient prefers oral option over injection   Plan: Continue taking rosuvastatin 20 mg and ezetimibe 10 mg daily  Will apply for PA for Nexlizet 180/10, upon approval can discontinue Zetia and continue taking rosuvastatin 20 mg and Nexlizet 180/10 once daily  Will get baseline uric acid today, follow up lab - uric acid in 4 weeks and lipid in 12 weeks  Thank you,  16/03/930, Pharm.D [facility] A Division of [facility] [address], [place], [address] [place]  Phone: [phone]; Fax: [phone]

## 2022-02-04 NOTE — Patient Instructions (Addendum)
Changes made by your pharmacist Carmela Hurt, PharmD at today's visit:    Instructions/Changes  (what do you need to do) Your Notes  (what you did and when you did it)  1.Continue taking Rosuvastatin 20 mg daily and ezetimibe 10 mg   2. Stop taking Ezetimibe 10 mg once you start taking Naxlizet    3. Start taking Naxlizet 180-10 mg once daily     Nexlizet requires prior authorization (PA) from insurance. I will work on [place]. Will inform you upon approval.     If you have any questions or concerns please use My Chart to send questions or call the office at [phone]

## 2022-02-04 NOTE — Assessment & Plan Note (Signed)
Assessment:  LDLc above the goal 84 mg/dl on 68/10/8108. (Goal 70mg /dl) Risk Factors: age, LDLc  >70 mg/dl, coronary calcification with Ca score 77.1, family hx of CAD Takes high intensity statin and ezetimibe 10 mg regularly and tolerates them well Discussed next mediation options to lower LDL- nexletol, PCSK9i, inclisiran their effectiveness, possible side effects and cost  Patient prefers oral option over injection   Plan: Continue taking rosuvastatin 20 mg and ezetimibe 10 mg daily  Will apply for PA for Nexlizet 180/10, upon approval can discontinue Zetia and continue taking rosuvastatin 20 mg and Nexlizet 180/10 once daily  Will get baseline uric acid today, follow up lab - uric acid in 4 weeks and lipid in 12 weeks

## 2022-02-04 NOTE — Telephone Encounter (Addendum)
Applied for [employer] PA  Key: [id number] approved through 03/08/2023.  Call pharmacy to find co-pay, it is $100 for 30 days supply. Patient was ok to get enrolled in the [employer] grant (discussed during the visit). Enrolled in the  grant- good until 01/06/2023.  Patient informed and co-pay card information communicated via MyChart.  CARD NO. [id number]   CARD STATUS Active   BIN [id number]   PCN [id number]   PC GROUP [id number]

## 2022-02-24 ENCOUNTER — Telehealth: Payer: Self-pay

## 2022-02-24 NOTE — Telephone Encounter (Signed)
Pt called back regarding the uric acid lab result.    Pt made aware of uric acid level falling in the normal range, as ready by Dr. Ladona Ridgel.   Pt told to focus on her plan of care, and no changes are recommended at this time.   Pt understood communication.

## 2022-02-24 NOTE — Telephone Encounter (Signed)
-----   Message from Marinus Maw, MD sent at 02/04/2022  9:07 PM EST ----- Normal uric acid

## 2022-04-22 ENCOUNTER — Other Ambulatory Visit: Payer: Self-pay

## 2022-04-22 ENCOUNTER — Ambulatory Visit
Admission: RE | Admit: 2022-04-22 | Discharge: 2022-04-22 | Disposition: A | Discharge status: Home / Self Care | Payer: Medicare Other | Source: Ambulatory Visit | Attending: Family Medicine | Admitting: Family Medicine

## 2022-04-22 ENCOUNTER — Encounter: Payer: Self-pay | Admitting: Cardiology

## 2022-04-22 DIAGNOSIS — J069 Acute upper respiratory infection, unspecified: Secondary | ICD-10-CM

## 2022-04-22 DIAGNOSIS — R051 Acute cough: Secondary | ICD-10-CM

## 2022-04-27 NOTE — Progress Notes (Unsigned)
Office Visit    Patient Name: Toni Harris Date of Encounter: 04/27/2022  Primary Care Provider:  [place] Primary Cardiologist:  None Primary Electrophysiologist: None  Chief Complaint    Toni Harris is a 72 y.o. female with PMH of coronary calcifications, HLD, family history of CAD, hypothyroidism, chronic venous insufficiency who presents today for complaint of palpitations.  Past Medical History    Past Medical History:  Diagnosis Date   Agatston CAC score, <100    Allergy    Chest tightness    Estrogen deficiency    Family history of cardiovascular disease    Hyperlipidemia    Thyroid disease    hypo   Vertigo    Past Surgical History:  Procedure Laterality Date   ECTOPIC PREGNANCY SURGERY     x2    Allergies  No Known Allergies  History of Present Illness    Toni Harris  is a 72 year old female with the above mention past medical history who presents today for complaint of coronary calcifications.  She was initially seen on 12/2021 by Dr. Johney Frame to establish care due to elevated calcium score.  She was found to have a calcium score 77.1 in the setting of a strong family history of coronary artery disease.  She was started on Zetia and referred to the lipid clinic for possible PCSK9 inhibitor.  She had no cardiac complaints or concerns during her visit.  She denied any chest pain, lightheadedness, or syncope.  Since last being seen in the office patient reports***.  Patient denies chest pain, palpitations, dyspnea, PND, orthopnea, nausea, vomiting, dizziness, syncope, edema, weight gain, or early satiety.     ***Notes:  Home Medications    Current Outpatient Medications  Medication Sig Dispense Refill   Bempedoic Acid-Ezetimibe (NEXLIZET) 180-10 MG TABS Take 1 tablet by mouth daily. 30 tablet 11   fluticasone (FLONASE) 50 MCG/ACT nasal spray Place 1 spray into both nostrils daily as needed for allergies or  rhinitis.     rizatriptan (MAXALT) 5 MG tablet TAKE 1 TABLET BY MOUTH AS NEEDED FOR MIGRAINE. MAY REPEAT IN 2 HOURS IF NEEDED     rosuvastatin (CRESTOR) 20 MG tablet Take 20 mg by mouth at bedtime.     No current facility-administered medications for this visit.     Review of Systems  Please see the history of present illness.    (+)*** (+)***  All other systems reviewed and are otherwise negative except as noted above.  Physical Exam    Wt Readings from Last 3 Encounters:  12/24/21 149 lb (67.6 kg)  06/12/20 152 lb (68.9 kg)  07/17/19 152 lb (68.9 kg)   [id number] were no vitals filed for this visit.,There is no height or weight on file to calculate BMI.  Constitutional:      Appearance: Healthy appearance. Not in distress.  Neck:     Vascular: JVD normal.  Pulmonary:     Effort: Pulmonary effort is normal.     Breath sounds: No wheezing. No rales. Diminished in the bases Cardiovascular:     Normal rate. Regular rhythm. Normal S1. Normal S2.      Murmurs: There is no murmur.  Edema:    Peripheral edema absent.  Abdominal:     Palpations: Abdomen is soft non tender. There is no hepatomegaly.  Skin:    General: Skin is warm and dry.  Neurological:     General: No focal deficit present.  Mental Status: Alert and oriented to person, place and time.     Cranial Nerves: Cranial nerves are intact.  EKG/LABS/Other Studies Reviewed    ECG personally reviewed by me today - ***  Risk Assessment/Calculations:   {Does this patient have ATRIAL FIBRILLATION?:[id number]}        Lab Results  Component Value Date   WBC 5.0 04/14/2016   HGB 13.2 04/14/2016   HCT 39.0 04/14/2016   MCV 93.8 04/14/2016   PLT 206 04/14/2016   Lab Results  Component Value Date   CREATININE 0.77 04/14/2016   BUN 19 04/14/2016   NA 138 04/14/2016   K 4.0 04/14/2016   CL 105 04/14/2016   CO2 25 04/14/2016   Lab Results  Component Value Date   ALT 19 04/14/2016   AST 27 04/14/2016    ALKPHOS 84 04/14/2016   BILITOT 0.4 04/14/2016   No results found for: "CHOL", "HDL", "LDLCALC", "LDLDIRECT", "TRIG", "CHOLHDL"  No results found for: "HGBA1C"  Assessment & Plan    1.  Palpitations:  2.  Coronary calcifications:  3.  Hyperlipidemia:  4.  Family history of CAD:      Disposition: Follow-up with None or APP in *** months {Are you ordering a CV Procedure (e.g. stress test, cath, DCCV, TEE, etc)?   Press F2        :[id number]   Medication Adjustments/Labs and Tests Ordered: Current medicines are reviewed at length with the patient today.  Concerns regarding medicines are outlined above.   Signed, Mable Fill, Marissa Nestle, NP 04/27/2022, 8:23 PM [facility]  Note:  This document was prepared using Dragon voice recognition software and may include unintentional dictation errors.

## 2022-04-29 ENCOUNTER — Other Ambulatory Visit: Payer: Self-pay | Admitting: Nurse Practitioner

## 2022-04-29 ENCOUNTER — Ambulatory Visit: Payer: Medicare Other | Attending: Nurse Practitioner | Admitting: Nurse Practitioner

## 2022-04-29 ENCOUNTER — Encounter: Payer: Self-pay | Admitting: Nurse Practitioner

## 2022-04-29 VITALS — BP 172/60 | HR 90 | Ht 66.0 in | Wt 149.0 lb

## 2022-04-29 DIAGNOSIS — R931 Abnormal findings on diagnostic imaging of heart and coronary circulation: Secondary | ICD-10-CM

## 2022-04-29 DIAGNOSIS — E782 Mixed hyperlipidemia: Secondary | ICD-10-CM

## 2022-04-29 DIAGNOSIS — Z8249 Family history of ischemic heart disease and other diseases of the circulatory system: Secondary | ICD-10-CM | POA: Diagnosis not present

## 2022-04-29 DIAGNOSIS — R002 Palpitations: Secondary | ICD-10-CM | POA: Diagnosis not present

## 2022-04-29 MED ORDER — METOPROLOL SUCCINATE ER 25 MG PO TB24: 12.5000 mg | ORAL_TABLET | Freq: Every day | ORAL | 0 refills | Status: DC

## 2022-04-29 MED ORDER — METOPROLOL TARTRATE 25 MG PO TABS: 12.5000 mg | ORAL_TABLET | Freq: Every day | ORAL | 0 refills | Status: DC | PRN

## 2022-04-29 NOTE — Patient Instructions (Addendum)
Medication Instructions:  START Metoprolol Succinate 12.5 mg take 1 tablet as needed for palpitations *If you need a refill on your cardiac medications before your next appointment, please call your pharmacy*   Lab Work: None ordered   Testing/Procedures: Your physician has requested that you have an echocardiogram. Echocardiography is a painless test that uses sound waves to create images of your heart. It provides your doctor with information about the size and shape of your heart and how well your heart's chambers and valves are working. This procedure takes approximately one hour. There are no restrictions for this procedure. Please do NOT wear cologne, perfume, aftershave, or lotions (deodorant is allowed). Please arrive 15 minutes prior to your appointment time.   Follow-Up: At [facility], you and your health needs are our priority.  As part of our continuing mission to provide you with exceptional heart care, we have created designated Provider Care Teams.  These Care Teams include your primary Cardiologist (physician) and Advanced Practice Providers (APPs -  Physician Assistants and Nurse Practitioners) who all work together to provide you with the care you need, when you need it.  We recommend signing up for the patient portal called "MyChart".  Sign up information is provided on this After Visit Summary.  MyChart is used to connect with patients for Virtual Visits (Telemedicine).  Patients are able to view lab/test results, encounter notes, upcoming appointments, etc.  Non-urgent messages can be sent to your provider as well.   To learn more about what you can do with MyChart, go to [email or web address].    Your next appointment:   8 Months   Provider:   Gwyndolyn Kaufman, MD Other Instructions

## 2022-05-27 ENCOUNTER — Ambulatory Visit (HOSPITAL_COMMUNITY): Payer: Medicare Other | Attending: Nurse Practitioner

## 2022-05-27 DIAGNOSIS — R002 Palpitations: Secondary | ICD-10-CM

## 2022-05-27 DIAGNOSIS — I358 Other nonrheumatic aortic valve disorders: Secondary | ICD-10-CM

## 2022-05-27 DIAGNOSIS — Z8249 Family history of ischemic heart disease and other diseases of the circulatory system: Secondary | ICD-10-CM | POA: Insufficient documentation

## 2022-05-27 DIAGNOSIS — I517 Cardiomegaly: Secondary | ICD-10-CM | POA: Diagnosis not present

## 2022-05-27 DIAGNOSIS — E785 Hyperlipidemia, unspecified: Secondary | ICD-10-CM | POA: Insufficient documentation

## 2022-05-27 DIAGNOSIS — R0789 Other chest pain: Secondary | ICD-10-CM | POA: Insufficient documentation

## 2022-05-27 LAB — ECHOCARDIOGRAM COMPLETE
Area-P 1/2: 3.42 cm2
S' Lateral: 2.8 cm

## 2022-06-25 ENCOUNTER — Other Ambulatory Visit: Payer: Self-pay | Admitting: Nurse Practitioner

## 2022-12-16 ENCOUNTER — Other Ambulatory Visit: Payer: Self-pay

## 2022-12-16 MED ORDER — EZETIMIBE 10 MG PO TABS: 10.0000 mg | ORAL_TABLET | Freq: Every day | ORAL | 1 refills | Status: DC

## 2022-12-30 NOTE — Progress Notes (Signed)
Cardiology Office Note:    Date:  01/10/2023   ID:  Toni Harris, DOB 09/04/1950, MRN [id number]  PCP:  [facility]   [facility] Providers Cardiologist:  None     Referring MD: Roe Coombs*   Chief Complaint  Patient presents with   Coronary Artery Disease    History of Present Illness:    Toni Harris is a 72 y.o. female seen for follow up coronary artery calcification and palpitations. Former patient of Dr Shari Prows. She was initially seen on 12/2021 by Dr. Shari Prows  due to elevated calcium score. She was found to have a calcium score 77.1 in the setting of a strong family history of coronary artery disease. She was started on Zetia and referred to the lipid clinic. She elected at that time not to take additional therapy. On Crestor and Zetia.  Was seen by Robin Searing NP in Feb with a couple of episodes of palpitations. Given Rx for PRN Toprol but really hasn't had to take. Echo was unremarkable.   She is concerned about her coronary calcium. She did get expected reduction in LDL from 217 to 100 on combination of Crestor and Zetia. She is tolerating well. She has no active cardiac complaints.   Past Medical History:  Diagnosis Date   Agatston CAC score, <100    Allergy    Chest tightness    Estrogen deficiency    Family history of cardiovascular disease    Hyperlipidemia    Thyroid disease    hypo   Vertigo     Past Surgical History:  Procedure Laterality Date   ECTOPIC PREGNANCY SURGERY     x2    Current Medications: Current Meds  Medication Sig   amoxicillin (AMOXIL) 500 MG capsule Take 500 mg by mouth daily.   ezetimibe (ZETIA) 10 MG tablet Take 1 tablet (10 mg total) by mouth daily.   fluticasone (FLONASE) 50 MCG/ACT nasal spray Place 1 spray into both nostrils daily as needed for allergies or rhinitis.   rizatriptan (MAXALT) 5 MG tablet TAKE 1 TABLET BY MOUTH AS NEEDED FOR MIGRAINE. MAY REPEAT IN 2  HOURS IF NEEDED   rosuvastatin (CRESTOR) 20 MG tablet Take 20 mg by mouth at bedtime.   [DISCONTINUED] metoprolol succinate (TOPROL-XL) 25 MG 24 hr tablet TAKE 1/2 TABLET(12.5 MG) BY MOUTH DAILY     Allergies:   Patient has no known allergies.   Social History   Socioeconomic History   Marital status: Married    Spouse name: Not on file   Number of children: 0   Years of education: Not on file   Highest education level: Not on file  Occupational History   Not on file  Tobacco Use   Smoking status: Never   Smokeless tobacco: Never  Vaping Use   Vaping status: Never Used  Substance and Sexual Activity   Alcohol use: Yes    Comment: daily   Drug use: Yes    Types: Marijuana    Comment: occasionally   Sexual activity: Not on file  Other Topics Concern   Not on file  Social History Narrative   Not on file   Social Determinants of Health   Financial Resource Strain: Not on file  Food Insecurity: Not on file  Transportation Needs: Not on file  Physical Activity: Not on file  Stress: Not on file  Social Connections: Not on file     Family History: The patient's family history includes  Heart disease in her mother; Hodgkin's lymphoma in her father; Pulmonary embolism in her mother; Varicose Veins in her maternal grandmother and mother.  ROS:   Please see the history of present illness.     All other systems reviewed and are negative.  EKGs/Labs/Other Studies Reviewed:    The following studies were reviewed today: Echo: 05/26/12: IMPRESSIONS     1. Left ventricular ejection fraction, by estimation, is 60 to 65%. The  left ventricle has normal function. The left ventricle has no regional  wall motion abnormalities. Left ventricular diastolic parameters were  normal. The average left ventricular  global longitudinal strain is -23.2 %. The global longitudinal strain is  normal.   2. Right ventricular systolic function is normal. The right ventricular  size is normal.    3. Left atrial size was mildly dilated.   4. The mitral valve is normal in structure. Trivial mitral valve  regurgitation. No evidence of mitral stenosis.   5. The aortic valve is tricuspid. Aortic valve regurgitation is not  visualized. Aortic valve sclerosis is present, with no evidence of aortic  valve stenosis.   6. The inferior vena cava is normal in size with greater than 50%  respiratory variability, suggesting right atrial pressure of 3 mmHg.   Comparison(s): No prior Echocardiogram.        Recent Labs: No results found for requested labs within last 365 days.  Recent Lipid Panel No results found for: "CHOL", "TRIG", "HDL", "CHOLHDL", "VLDL", "LDLCALC", "LDLDIRECT"  Dated 12/01/22: cholesterol 173, triglycerides 151, HDL 48, LDL 100. CBC, CMET, TSH normal  Risk Assessment/Calculations:                Physical Exam:    VS:  BP 120/60 (BP Location: Left Arm, Patient Position: Sitting, Cuff Size: Normal)   Pulse 77   Ht 5\' 6"  (1.676 m)   Wt 150 lb (68 kg)   SpO2 95%   BMI 24.21 kg/m     Wt Readings from Last 3 Encounters:  01/10/23 150 lb (68 kg)  04/29/22 149 lb (67.6 kg)  12/24/21 149 lb (67.6 kg)     GEN:  Well nourished, well developed in no acute distress HEENT: Normal NECK: No JVD; No carotid bruits LYMPHATICS: No lymphadenopathy CARDIAC: RRR, no murmurs, rubs, gallops RESPIRATORY:  Clear to auscultation without rales, wheezing or rhonchi  ABDOMEN: Soft, non-tender, non-distended MUSCULOSKELETAL:  No edema; No deformity  SKIN: Warm and dry NEUROLOGIC:  Alert and oriented x 3 PSYCHIATRIC:  Normal affect   ASSESSMENT:    1. Mixed hyperlipidemia   2. Agatston coronary artery calcium score less than 100    PLAN:    In order of problems listed above:  Coronary artery calcification. Main risk factor is hypercholesterolemia. 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 We discussed lifestyle modification including  heart healthy diet and regular aerobic exercise. Focus on lipid lowering therapy HLD likely familial HLD. She has fairly good lowering with Crestor and Zetia but is still not at goal. I think increasing Crestor or adding bempidoic acid would offer marginal improvement. I have recommended she take Repatha. Will schedule follow up with pharm D to discuss.           Medication Adjustments/Labs and Tests Ordered: Current medicines are reviewed at length with the patient today.  Concerns regarding medicines are outlined above.  No orders of the defined types were placed in this encounter.  Meds ordered this encounter  Medications  metoprolol succinate (TOPROL-XL) 25 MG 24 hr tablet    Sig: Take 0.5 tablets (12.5 mg total) by mouth daily as needed (palpitations).    Dispense:  45 tablet    Refill:  3    **Patient requests 90 days supply**    There are no Patient Instructions on file for this visit.   Signed, Anthem Frazer [place], MD  01/10/2023 2:54 PM    [facility]

## 2023-01-10 ENCOUNTER — Ambulatory Visit: Payer: Medicare Other | Attending: Cardiology | Admitting: Cardiology

## 2023-01-10 ENCOUNTER — Encounter: Payer: Self-pay | Admitting: Cardiology

## 2023-01-10 VITALS — BP 120/60 | HR 77 | Ht 66.0 in | Wt 150.0 lb

## 2023-01-10 DIAGNOSIS — R931 Abnormal findings on diagnostic imaging of heart and coronary circulation: Secondary | ICD-10-CM

## 2023-01-10 DIAGNOSIS — E782 Mixed hyperlipidemia: Secondary | ICD-10-CM

## 2023-01-10 MED ORDER — METOPROLOL SUCCINATE ER 25 MG PO TB24: 12.5000 mg | ORAL_TABLET | Freq: Every day | ORAL | 3 refills | Status: DC | PRN

## 2023-01-10 NOTE — Patient Instructions (Signed)
Medication Instructions:  Continue all medication  *If you need a refill on your cardiac medications before your next appointment, please call your pharmacy*   Lab Work: None  If you have labs (blood work) drawn today and your tests are completely normal, you will receive your results only by: MyChart Message (if you have MyChart) OR A paper copy in the mail If you have any lab test that is abnormal or we need to change your treatment, we will call you to review the results.   Testing/Procedures: None    Follow-Up: At [facility], you and your health needs are our priority.  As part of our continuing mission to provide you with exceptional heart care, we have created designated Provider Care Teams.  These Care Teams include your primary Cardiologist (physician) and Advanced Practice Providers (APPs -  Physician Assistants and Nurse Practitioners) who all work together to provide you with the care you need, when you need it.   Your next appointment:   6 month(s)  call in February to make an appointment May   Provider:   Dr.Jordan   Schedule an appointment with Pharm D lipid clinic

## 2023-01-31 ENCOUNTER — Other Ambulatory Visit: Payer: Self-pay | Admitting: Internal Medicine

## 2023-01-31 DIAGNOSIS — E782 Mixed hyperlipidemia: Secondary | ICD-10-CM

## 2023-02-08 ENCOUNTER — Ambulatory Visit: Payer: Medicare Other

## 2023-02-17 ENCOUNTER — Ambulatory Visit
Payer: Medicare Other | Attending: Cardiovascular Disease | Admitting: Pharmacist Clinician (PhC)/ Clinical Pharmacy Specialist

## 2023-02-17 DIAGNOSIS — E7801 Familial hypercholesterolemia: Secondary | ICD-10-CM

## 2023-02-17 MED ORDER — ROSUVASTATIN CALCIUM 20 MG PO TABS: ORAL_TABLET | ORAL | 3 refills | Status: DC

## 2023-02-17 NOTE — Patient Instructions (Signed)
Your Results:             Your most recent labs Goal  Total Cholesterol 173 < 200  Triglycerides 151 < 150  HDL (happy/good cholesterol) 48 > 40  LDL (lousy/bad cholesterol 100 < 70   Medication changes:  Increase your rosuvastatin to 40 mg twice weekly and 20 mg all other days.   Continue with the ezetimibe (Zetia) once daily  Lab orders:  We want to repeat labs after 3-4 months.  We will send you a lab order to remind you once we get closer to that time.     Thank you for choosing [facility]

## 2023-02-17 NOTE — Progress Notes (Unsigned)
   Office Visit    Patient Name: Toni Harris Date of Encounter: 02/17/2023  Primary Care Provider:  [facility] Primary Cardiologist:  None  Chief Complaint    Hyperlipidemia - familial  Significant Past Medical History   CAD CAC = 77.1     No Known Allergies  History of Present Illness    Toni Harris is a 72 y.o. female patient of Dr [place], in the office today to discuss options for cholesterol management.  Takes zetia 3 days per week Doesn't want more meds Has been on rosuvastatin for 2 years  Baseline LDL 216  10/2019  Insurance Carrier:  [employer]  Repatha $45/month  LDL Cholesterol goal:    Current Medications:     Previously tried:    Family Hx:   mother had PE, MI, maternal GF died 38 from second MI;  father died at 71 from cancer; sister on statin,  no children  Social Hx: Tobacco:no Alcohol: wine daily      Diet:  more home cooked foods, admits hang up is french fries; limits meats to fish and chicken; plenty of vegetables and fruits; doesn't snack much  Exercise: pilates and yoga twic weekly each, has 3 german shephards, woalking daily  Adherence Assessment  Do you ever forget to take your medication? [] Yes [] No  Do you ever skip doses due to side effects? [] Yes [] No  Do you have trouble affording your medicines? [] Yes [] No  Are you ever unable to pick up your medication due to transportation difficulties? [] Yes [] No  Do you ever stop taking your medications because you don't believe they are helping? [] Yes [] No  Do you check your weight daily? [] Yes [] No   Adherence strategy: ***  Barriers to obtaining medications: ***  Accessory Clinical Findings   9/24:: TC 173, TG 151, HDL 48, LDL 100  No results found for: "LIPOA"  Lab Results  Component Value Date   ALT 19 04/14/2016   AST 27 04/14/2016   ALKPHOS 84 04/14/2016   BILITOT 0.4 04/14/2016   Lab Results  Component Value Date    CREATININE 0.77 04/14/2016   BUN 19 04/14/2016   NA 138 04/14/2016   K 4.0 04/14/2016   CL 105 04/14/2016   CO2 25 04/14/2016   No results found for: "HGBA1C"  Home Medications    Current Outpatient Medications  Medication Sig Dispense Refill   amoxicillin (AMOXIL) 500 MG capsule Take 500 mg by mouth daily.     ezetimibe (ZETIA) 10 MG tablet Take 1 tablet (10 mg total) by mouth daily. 90 tablet 1   fluticasone (FLONASE) 50 MCG/ACT nasal spray Place 1 spray into both nostrils daily as needed for allergies or rhinitis.     metoprolol succinate (TOPROL-XL) 25 MG 24 hr tablet Take 0.5 tablets (12.5 mg total) by mouth daily as needed (palpitations). 45 tablet 3   rizatriptan (MAXALT) 5 MG tablet TAKE 1 TABLET BY MOUTH AS NEEDED FOR MIGRAINE. MAY REPEAT IN 2 HOURS IF NEEDED     rosuvastatin (CRESTOR) 20 MG tablet Take 20 mg by mouth at bedtime.     No current facility-administered medications for this visit.     Assessment & Plan    No problem-specific Assessment & Plan notes found for this encounter.   Phillips Hay, PharmD CPP [facility] [address]  [place], [place] [address] [phone]  02/17/2023, 8:49 AM

## 2023-03-07 ENCOUNTER — Encounter: Payer: Self-pay | Admitting: Pharmacist Clinician (PhC)/ Clinical Pharmacy Specialist

## 2023-03-07 NOTE — Assessment & Plan Note (Signed)
Assessment: Patient with familial hyperlipidemia not at LDL goal of < 70 Most recent LDL 100 on 11/2022 Has been compliant with high intensity statin/ezetimibe : rosuvastatin 20, ezetimibe 10 three days per week Reviewed options for lowering LDL cholesterol, including ezetimibe, PCSK-9 inhibitors, bempedoic acid and inclisiran.  Discussed mechanisms of action, dosing, side effects, potential decreases in LDL cholesterol and costs.  Also reviewed potential options for patient assistance.  Plan: Patient agreeable to increasing rosuvastatin to 40 mg twice weekly She is not interested in starting additional medication at this time Will increase ezetimibe 10 daily use Repeat labs after:  3 months Lipid Liver function

## 2023-06-26 ENCOUNTER — Other Ambulatory Visit: Payer: Self-pay | Admitting: Nurse Practitioner

## 2023-11-05 NOTE — Progress Notes (Unsigned)
 Cardiology Office Note:    Date:  11/10/2023   ID:  Toni Harris, DOB Jul 19, 1950, MRN [id number]  [facility]P:  [facility]   [facility] Providers Cardiologist:  None     Referring MD: Toni Harris*   Chief Complaint  Patient presents with   Coronary Artery Disease   Hyperlipidemia    History of Present Illness:    Toni Harris is a 73 y.o. female seen for follow up coronary artery calcification and palpitations. Former patient of Dr Hobart. She was initially seen on 12/2021 by Dr. Hobart  due to elevated calcium  score. She was found to have a calcium  score 77.1 in the setting of a strong family history of coronary artery disease. She was started on Zetia  and referred to the lipid clinic. She elected at that time not to take additional therapy. On Crestor  and Zetia .  Was seen by Toni Alberts NP in Feb with a couple of episodes of palpitations. Given Rx for PRN Toprol  but really hasn't had to take. Echo was unremarkable.    She did get expected reduction in LDL from 217 to 100 on combination of Crestor  and Zetia . She does note some stiffness and more difficulty getting up. Wonders if related to statin. Normally BP is fine but she states she gets anxious at doctor's. No chest pain or dyspnea.   Past Medical History:  Diagnosis Date   Agatston CAC score, <100    Allergy    Chest tightness    Estrogen deficiency    Family history of cardiovascular disease    Hyperlipidemia    Thyroid  disease    hypo   Vertigo     Past Surgical History:  Procedure Laterality Date   ECTOPIC PREGNANCY SURGERY     x2    Current Medications: Current Meds  Medication Sig   ezetimibe  (ZETIA ) 10 MG tablet TAKE 1 TABLET(10 MG) BY MOUTH DAILY   fluticasone (FLONASE) 50 MCG/ACT nasal spray Place 1 spray into both nostrils daily as needed for allergies or rhinitis.   metoprolol  succinate (TOPROL -XL) 25 MG 24 hr tablet Take 0.5 tablets (12.5 mg  total) by mouth daily as needed (palpitations).   rizatriptan (MAXALT) 5 MG tablet TAKE 1 TABLET BY MOUTH AS NEEDED FOR MIGRAINE. MAY REPEAT IN 2 HOURS IF NEEDED   rosuvastatin  (CRESTOR ) 20 MG tablet Take 1 tablet by mouth 5 days per week and 2 tablets on Mondays and Fridays     Allergies:   Patient has no known allergies.   Social History   Socioeconomic History   Marital status: Married    Spouse name: Not on file   Number of children: 0   Years of education: Not on file   Highest education level: Not on file  Occupational History   Not on file  Tobacco Use   Smoking status: Never   Smokeless tobacco: Never  Vaping Use   Vaping status: Never Used  Substance and Sexual Activity   Alcohol use: Yes    Comment: daily   Drug use: Yes    Types: Marijuana    Comment: occasionally   Sexual activity: Not on file  Other Topics Concern   Not on file  Social History Narrative   Not on file   Social Drivers of Health   Financial Resource Strain: Not on file  Food Insecurity: Not on file  Transportation Needs: Not on file  Physical Activity: Not on file  Stress: Not on file  Social Connections: Not on file     Family History: The patient's family history includes Heart disease in her mother; Hodgkin's lymphoma in her father; Pulmonary embolism in her mother; Varicose Veins in her maternal grandmother and mother.  ROS:   Please see the history of present illness.     All other systems reviewed and are negative.  EKGs/Labs/Other Studies Reviewed:    The following studies were reviewed today: Echo: 05/26/12: IMPRESSIONS     1. Left ventricular ejection fraction, by estimation, is 60 to 65%. The  left ventricle has normal function. The left ventricle has no regional  wall motion abnormalities. Left ventricular diastolic parameters were  normal. The average left ventricular  global longitudinal strain is -23.2 %. The global longitudinal strain is  normal.   2. Right  ventricular systolic function is normal. The right ventricular  size is normal.   3. Left atrial size was mildly dilated.   4. The mitral valve is normal in structure. Trivial mitral valve  regurgitation. No evidence of mitral stenosis.   5. The aortic valve is tricuspid. Aortic valve regurgitation is not  visualized. Aortic valve sclerosis is present, with no evidence of aortic  valve stenosis.   6. The inferior vena cava is normal in size with greater than 50%  respiratory variability, suggesting right atrial pressure of 3 mmHg.   Comparison(s): No prior Echocardiogram.   EKG Interpretation Date/Time:  Thursday November 10 2023 09:23:55 EDT Ventricular Rate:  60 PR Interval:  182 QRS Duration:  72 QT Interval:  412 QTC Calculation: 412 R Axis:   31  Text Interpretation: Normal sinus rhythm Normal ECG When compared with ECG of Apr 09, 2022 No significant change was found Confirmed by [place], Amish Mintzer [id number]) on 11/10/2023 9:30:05 AM   EKG Interpretation Date/Time:  Thursday November 10 2023 09:23:55 EDT Ventricular Rate:  60 PR Interval:  182 QRS Duration:  72 QT Interval:  412 QTC Calculation: 412 R Axis:   31  Text Interpretation: Normal sinus rhythm Normal ECG When compared with ECG of Apr 09, 2022 No significant change was found Confirmed by [place], Janera Peugh [phone]) on 11/10/2023 9:30:05 AM   Recent Labs: No results found for requested labs within last 365 days.  Recent Lipid Panel No results found for: CHOL, TRIG, HDL, CHOLHDL, VLDL, LDLCALC, LDLDIRECT  Dated 12/01/22: cholesterol 173, triglycerides 151, HDL 48, LDL 100. CBC, CMET, TSH normal  Risk Assessment/Calculations:       Physical Exam:    VS:  BP (!) 164/70   Pulse 60   Ht 5' 6 (1.676 m)   Wt 144 lb 3.2 oz (65.4 kg)   SpO2 97%   BMI 23.27 kg/m     Wt Readings from Last 3 Encounters:  11/10/23 144 lb 3.2 oz (65.4 kg)  01/10/23 150 lb (68 kg)  04/29/22 149 lb (67.6 kg)     GEN:  Well  nourished, well developed in no acute distress HEENT: Normal NECK: No JVD; No carotid bruits LYMPHATICS: No lymphadenopathy CARDIAC: RRR, no murmurs, rubs, gallops RESPIRATORY:  Clear to auscultation without rales, wheezing or rhonchi  ABDOMEN: Soft, non-tender, non-distended MUSCULOSKELETAL:  No edema; No deformity  SKIN: Warm and dry NEUROLOGIC:  Alert and oriented x 3 PSYCHIATRIC:  Normal affect   ASSESSMENT:    1. Coronary artery disease involving native coronary artery of native heart without angina pectoris   2. Familial hypercholesterolemia   3. Agatston coronary artery calcium  score less than 100  PLAN:    In order of problems listed above:  Coronary artery calcification. Asymptomatic. Main risk factor is hypercholesterolemia. 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 We discussed lifestyle modification including heart healthy diet and regular aerobic exercise. Given joint stiffness we will give her a statin holiday to see if symptoms improve. Could trial atorvastatin instead or consider Repatha.  HLD likely familial HLD. She has fairly good lowering with Crestor  and Zetia . Will update labs today        Follow up in one year   Medication Adjustments/Labs and Tests Ordered: Current medicines are reviewed at length with the patient today.  Concerns regarding medicines are outlined above.  Orders Placed This Encounter  Procedures   EKG 12-Lead   No orders of the defined types were placed in this encounter.   There are no Patient Instructions on file for this visit.   Signed, Ryot Burrous [place], MD  11/10/2023 9:43 AM    [facility]

## 2023-11-10 ENCOUNTER — Ambulatory Visit: Attending: Cardiology | Admitting: Cardiology

## 2023-11-10 ENCOUNTER — Encounter: Payer: Self-pay | Admitting: Cardiology

## 2023-11-10 VITALS — BP 164/70 | HR 60 | Ht 66.0 in | Wt 144.2 lb

## 2023-11-10 DIAGNOSIS — E7801 Familial hypercholesterolemia: Secondary | ICD-10-CM

## 2023-11-10 DIAGNOSIS — I2510 Atherosclerotic heart disease of native coronary artery without angina pectoris: Secondary | ICD-10-CM | POA: Diagnosis not present

## 2023-11-10 DIAGNOSIS — R931 Abnormal findings on diagnostic imaging of heart and coronary circulation: Secondary | ICD-10-CM | POA: Diagnosis not present

## 2023-11-10 LAB — CBC WITH DIFFERENTIAL/PLATELET

## 2023-11-10 LAB — LIPID PANEL

## 2023-11-10 NOTE — Patient Instructions (Signed)
 Medication Instructions:  Stop Crestor   Call and let us  know if symptoms improve  Continue all other medications  *If you need a refill on your cardiac medications before your next appointment, please call your pharmacy*  Lab Work: Cmet,lipid panel,tsh,cbc today   Testing/Procedures: None ordered  Follow-Up: At [facility], you and your health needs are our priority.  As part of our continuing mission to provide you with exceptional heart care, our providers are all part of one team.  This team includes your primary Cardiologist (physician) and Advanced Practice Providers or APPs (Physician Assistants and Nurse Practitioners) who all work together to provide you with the care you need, when you need it.  Your next appointment:  1 year   Call in May to schedule Sept appointment     Provider:  Dr.Jordan    We recommend signing up for the patient portal called MyChart.  Sign up information is provided on this After Visit Summary.  MyChart is used to connect with patients for Virtual Visits (Telemedicine).  Patients are able to view lab/test results, encounter notes, upcoming appointments, etc.  Non-urgent messages can be sent to your provider as well.   To learn more about what you can do with MyChart, go to [email or web address].

## 2023-11-11 ENCOUNTER — Ambulatory Visit: Payer: Self-pay | Admitting: Cardiology

## 2023-11-11 LAB — CBC WITH DIFFERENTIAL/PLATELET
Basos: 1
EOS (ABSOLUTE): 0 x10E3/uL (ref 0.0–0.2)
Eos: 1
Hematocrit: 37.3 (ref 34.0–46.6)
Hemoglobin: 12.4 g/dL (ref 11.1–15.9)
Immature Granulocytes: 0
Immature Granulocytes: 0 x10E3/uL (ref 0.0–0.1)
Lymphs: 25
MCH: 31.4 pg (ref 26.6–33.0)
MCHC: 33.2 g/dL (ref 31.5–35.7)
MCV: 94 fL (ref 79–97)
Monocytes Absolute: 0.1 x10E3/uL (ref 0.0–0.4)
Monocytes Absolute: 0.6 x10E3/uL (ref 0.1–0.9)
Monocytes: 10
Neutrophils Absolute: 1.4 x10E3/uL (ref 0.7–3.1)
Neutrophils Absolute: 3.6 x10E3/uL (ref 1.4–7.0)
Neutrophils: 63
Platelets: 208 x10E3/uL (ref 150–450)
RBC: 3.95 x10E6/uL (ref 3.77–5.28)
RDW: 12.4 (ref 11.7–15.4)
WBC: 5.7 x10E3/uL (ref 3.4–10.8)

## 2023-11-11 LAB — LIPID PANEL
Cholesterol, Total: 167 mg/dL (ref 100–199)
HDL: 51 mg/dL (ref >39)
LDL CALC COMMENT:: 3.3 ratio (ref 0.0–4.4)
LDL Chol Calc (NIH): 87 mg/dL (ref 0–99)
Triglycerides: 168 mg/dL — AB (ref 0–149)
VLDL Cholesterol Cal: 29 mg/dL (ref 5–40)

## 2023-11-11 LAB — COMPREHENSIVE METABOLIC PANEL WITH GFR
ALT: 20 IU/L (ref 0–32)
AST: 28 IU/L (ref 0–40)
Albumin: 4.8 g/dL (ref 3.9–4.9)
Alkaline Phosphatase: 84 IU/L (ref 44–121)
BUN/Creatinine Ratio: 17 (ref 12–28)
BUN: 10 mg/dL (ref 8–27)
Bilirubin Total: 0.7 mg/dL (ref 0.0–1.2)
CO2: 20 mmol/L (ref 20–29)
Calcium: 10.1 mg/dL (ref 8.7–10.3)
Chloride: 103 mmol/L (ref 96–106)
Creatinine, Ser: 0.6 mg/dL (ref 0.57–1.00)
Globulin, Total: 2.2 g/dL (ref 1.5–4.5)
Glucose: 71 mg/dL (ref 70–99)
Potassium: 4.1 mmol/L (ref 3.5–5.2)
Sodium: 141 mmol/L (ref 134–144)
Total Protein: 7 g/dL (ref 6.0–8.5)
eGFR: 96 mL/min/1.73 (ref >59)

## 2023-11-11 LAB — TSH: TSH: 1.57 u[IU]/mL (ref 0.450–4.500)

## 2023-11-15 ENCOUNTER — Other Ambulatory Visit: Payer: Self-pay

## 2023-11-15 DIAGNOSIS — E7801 Familial hypercholesterolemia: Secondary | ICD-10-CM

## 2023-11-15 DIAGNOSIS — E782 Mixed hyperlipidemia: Secondary | ICD-10-CM

## 2023-11-21 ENCOUNTER — Encounter: Payer: Self-pay | Admitting: Pharmacist

## 2023-11-21 ENCOUNTER — Telehealth: Payer: Self-pay | Admitting: Pharmacist

## 2023-11-21 ENCOUNTER — Ambulatory Visit: Attending: Cardiology | Admitting: Pharmacist

## 2023-11-21 DIAGNOSIS — Z8249 Family history of ischemic heart disease and other diseases of the circulatory system: Secondary | ICD-10-CM | POA: Diagnosis not present

## 2023-11-21 DIAGNOSIS — I2510 Atherosclerotic heart disease of native coronary artery without angina pectoris: Secondary | ICD-10-CM

## 2023-11-21 DIAGNOSIS — M542 Cervicalgia: Secondary | ICD-10-CM | POA: Insufficient documentation

## 2023-11-21 DIAGNOSIS — E7801 Familial hypercholesterolemia: Secondary | ICD-10-CM | POA: Diagnosis not present

## 2023-11-21 DIAGNOSIS — K5730 Diverticulosis of large intestine without perforation or abscess without bleeding: Secondary | ICD-10-CM | POA: Insufficient documentation

## 2023-11-21 DIAGNOSIS — Z860100 Personal history of colon polyps, unspecified: Secondary | ICD-10-CM | POA: Insufficient documentation

## 2023-11-21 NOTE — Telephone Encounter (Signed)
 Please run PA for Repatha Shelva

## 2023-11-21 NOTE — Patient Instructions (Addendum)
 It was nice meeting you today  We would like your LDL (bad cholesterol) to be less than 70  The medication we discussed today is called Repatha, which is an injection you would take once every 2 weeks  I will complete the prior authorization for you and contact you with the result  Once you start the medication we will recheck your fasting lipid panel in about 2-3 months  Please let us  know if you have any questions  Medford Bolk, PharmD, BCACP, CDCES, CPP [facility] [address], [place], [place] [address] Phone: [phone]; Fax: [phone] 11/21/2023 2:17 PM

## 2023-11-21 NOTE — Progress Notes (Signed)
 Patient ID: Toni Harris                 DOB: 1950-12-03                    MRN: [id number]     HPI: Toni Harris is a 73 y.o. female patient referred to lipid clinic by Dr Swaziland. PMH is significant for CAD, HLD, and elevated coronary calcium  score.  Patient presents today in good spirits. Currently managed on ezetimibe  10mg  daily and was on rosuvastatin  20mg  five days a week but misunderstood instructions from Dr Swaziland and had discontinued a week ago. Has noticed she is no longer sore and stiff in the morning.  Is physically active. Gardens daily and does pilates and yoga. Diet is heart healthy but she does drink wine nightly.  Had coronary CT ordered by PCP on 11/24/21 which showed agatson score of 77.1 (73rd percentile for age/gender/race).  Has a significant family history of CAD on her mother side. Grandfather died from MI at 79 years and mother had PE and cardiac arrest.   Current Medications:  Zetia  10mg  daily Rosuvastatin  20mg  (not currently taking)  Risk Factors:  CAD Coronary Calcium  Family history  LDL goal: <70  Labs: TC 167, Trigs 168, HDL 51, LDL 87 (11/10/23)  Past Medical History:  Diagnosis Date   Agatston CAC score, <100    Allergy    Chest tightness    Estrogen deficiency    Family history of cardiovascular disease    Hyperlipidemia    Thyroid  disease    hypo   Vertigo     Current Outpatient Medications on File Prior to Visit  Medication Sig Dispense Refill   ezetimibe  (ZETIA ) 10 MG tablet TAKE 1 TABLET(10 MG) BY MOUTH DAILY 90 tablet 1   fluticasone (FLONASE) 50 MCG/ACT nasal spray Place 1 spray into both nostrils daily as needed for allergies or rhinitis.     metoprolol  succinate (TOPROL -XL) 25 MG 24 hr tablet Take 0.5 tablets (12.5 mg total) by mouth daily as needed (palpitations). 45 tablet 3   rizatriptan (MAXALT) 5 MG tablet TAKE 1 TABLET BY MOUTH AS NEEDED FOR MIGRAINE. MAY REPEAT IN 2 HOURS IF NEEDED     rosuvastatin  (CRESTOR ) 20 MG  tablet Take 1 tablet by mouth 5 days per week and 2 tablets on Mondays and Fridays 114 tablet 3   No current facility-administered medications on file prior to visit.    No Known Allergies  Assessment/Plan:  1. Hyperlipidemia - Patient last LDL  of 87 is above goal of <70. She feels better off rosuvastatin  and no longer wishes to continue. Therefore recommend starting PCSK9i.   Using demo pen, educated on mechanism of action, storage, site selection, administration, and possible adverse effects. Will complete PA and contact patient with response. Recheck lipid panel in 2-3 months.  Recommended decreasing wine consumption to one glass nightly.  Start Repatha/Praluent q 2 weeks Recheck lipid panel in 2-3 months  Chris Ellenora Talton, PharmD, BCACP, CDCES, CPP [facility] [address], [place], [place] [address] Phone: [phone]; Fax: [phone] 11/21/2023 4:50 PM

## 2023-11-22 ENCOUNTER — Other Ambulatory Visit (HOSPITAL_COMMUNITY): Payer: Self-pay

## 2023-11-22 NOTE — Telephone Encounter (Signed)
 Pharmacy Patient Advocate Encounter  Received notification from [facility] that Prior Authorization for REPATHA has been APPROVED from 11/22/23 to 05/21/24. Ran test claim, Copay is $286.32. This test claim was processed through [facility]- copay amounts may vary at other pharmacies due to pharmacy/plan contracts, or as the patient moves through the different stages of their insurance plan.

## 2023-11-22 NOTE — Telephone Encounter (Signed)
 Pharmacy Patient Advocate Encounter   Received notification from Physician's Office that prior authorization for REPATHA is required/requested.   Insurance verification completed.   The patient is insured through [facility] .   Per test claim: PA required; PA submitted to above mentioned insurance via Latent Key/confirmation #/EOC [id number] Status is pending

## 2023-11-24 ENCOUNTER — Telehealth: Payer: Self-pay | Admitting: Cardiology

## 2023-11-24 NOTE — Telephone Encounter (Signed)
 Spoke with pt regarding Repatha. Pt was notified that the PA had been approved. Pt was concerned about the copay cost. Pt was told the pharmacist would follow up with her regarding the medication. Pt verbalized understanding. All questions if any were answered.

## 2023-11-24 NOTE — Telephone Encounter (Signed)
  Patient called to follow up on the Repatha. She would like a prescription sent to [id number] - [place], [place] - [address]

## 2023-11-28 ENCOUNTER — Encounter: Payer: Self-pay | Admitting: Pharmacist

## 2023-11-28 ENCOUNTER — Other Ambulatory Visit (HOSPITAL_COMMUNITY): Payer: Self-pay

## 2023-11-28 MED ORDER — REPATHA SURECLICK 140 MG/ML ~~LOC~~ SOAJ
1.0000 mL | SUBCUTANEOUS | 11 refills | Status: DC
Start: 2023-11-28 — End: 2024-01-04

## 2023-12-30 ENCOUNTER — Encounter: Payer: Self-pay | Admitting: Cardiology

## 2023-12-30 NOTE — Telephone Encounter (Signed)
Message sent to Pharm D for advice.

## 2024-01-03 ENCOUNTER — Telehealth: Payer: Self-pay | Admitting: Cardiology

## 2024-01-03 DIAGNOSIS — E78019 Familial hypercholesterolemia, unspecified: Secondary | ICD-10-CM

## 2024-01-03 DIAGNOSIS — I2510 Atherosclerotic heart disease of native coronary artery without angina pectoris: Secondary | ICD-10-CM

## 2024-01-03 NOTE — Telephone Encounter (Signed)
 Pt c/o medication issue:  1. Name of Medication:   Evolocumab  (REPATHA  SURECLICK) 140 MG/ML SOAJ    2. How are you currently taking this medication (dosage and times per day)?   3. Are you having a reaction (difficulty breathing--STAT)?   4. What is your medication issue?   Repatha  is damaged or defective, per Selinda. He is requesting approval to replace it for patient. Please advise.  Phone#: [phone]  Fax#: [phone] Case#: [id number]

## 2024-01-04 MED ORDER — REPATHA SURECLICK 140 MG/ML ~~LOC~~ SOAJ: 1.0000 mL | SUBCUTANEOUS | 0 refills | Status: AC

## 2024-01-04 NOTE — Telephone Encounter (Signed)
 Replacement Rx sent to [facility]

## 2024-01-16 ENCOUNTER — Other Ambulatory Visit: Payer: Self-pay | Admitting: Cardiology

## 2024-02-25 ENCOUNTER — Other Ambulatory Visit: Payer: Self-pay | Admitting: Cardiology
